# Patient Record
Sex: Female | Born: 1955
Health system: Southern US, Community
[De-identification: ages and names within clinical notes are randomized; demographics above are authoritative.]

## PROBLEM LIST (undated history)

## (undated) DIAGNOSIS — E039 Hypothyroidism, unspecified: Secondary | ICD-10-CM

## (undated) DIAGNOSIS — R252 Cramp and spasm: Secondary | ICD-10-CM

## (undated) DIAGNOSIS — M810 Age-related osteoporosis without current pathological fracture: Secondary | ICD-10-CM

## (undated) DIAGNOSIS — K219 Gastro-esophageal reflux disease without esophagitis: Secondary | ICD-10-CM

## (undated) DIAGNOSIS — G4700 Insomnia, unspecified: Secondary | ICD-10-CM

## (undated) DIAGNOSIS — M419 Scoliosis, unspecified: Secondary | ICD-10-CM

## (undated) DIAGNOSIS — M2550 Pain in unspecified joint: Secondary | ICD-10-CM

---

## 2005-07-22 ENCOUNTER — Emergency Department (HOSPITAL_COMMUNITY): Admission: EM | Admit: 2005-07-22 | Discharge: 2005-07-22 | Payer: Self-pay | Admitting: Emergency Medicine

## 2005-07-22 IMAGING — CT CT HEAD W/O CM
1 series · 16 of 28 positions shown, 20 images · IV contrast (agent unspecified)
Comparison: None.

CLINICAL DATA: Status post fall.  
 HEAD CT WITHOUT CONTRAST:
TECHNIQUE: Contiguous axial images were obtained from the base of the skull through the vertex according to standard protocol without contrast.

[Series 2: brain · axial · 0.47mm/px · z∈[+126,+246]mm · 16 of 28 slices shown, 20 images]
[im 2/28  brain]
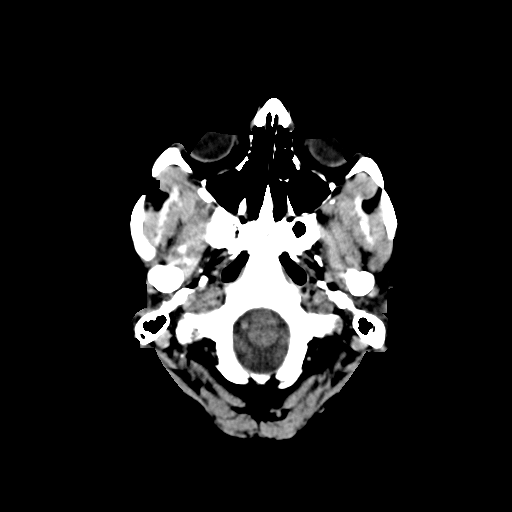
[im 2/28  bone]
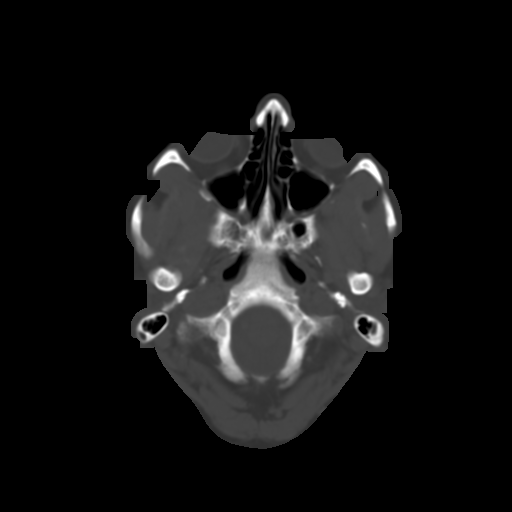
[im 4/28  brain]
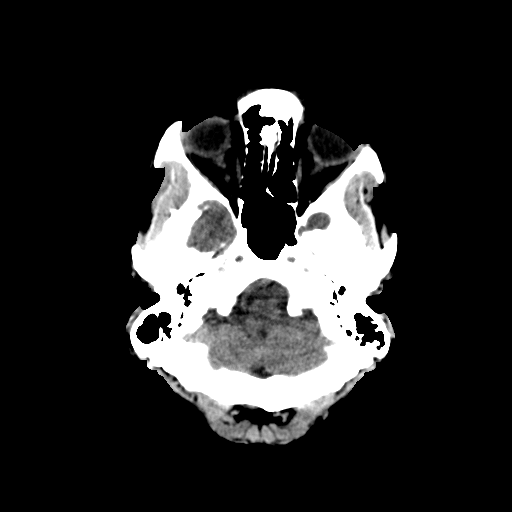
[im 6/28  brain]
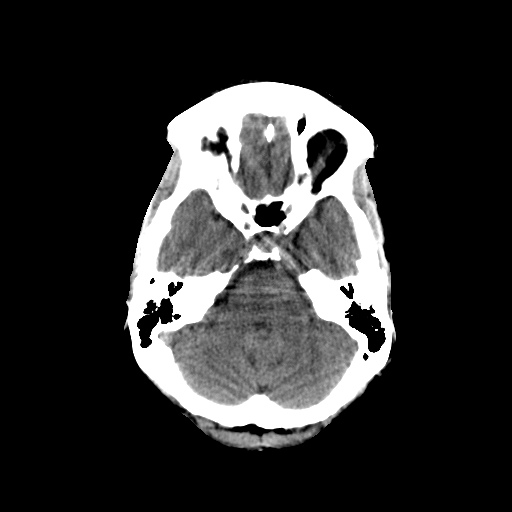
[im 7/28  brain]
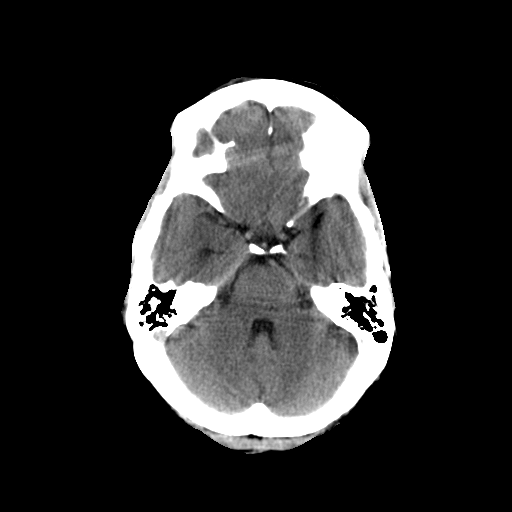
[im 9/28  brain]
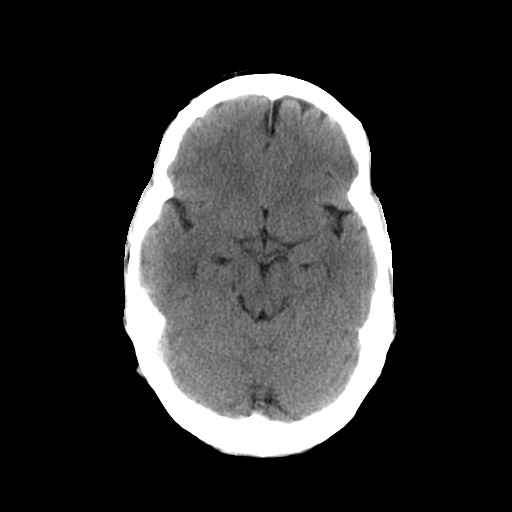
[im 9/28  bone]
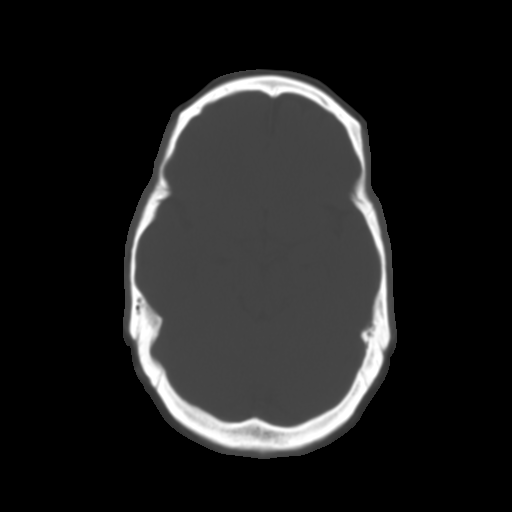
[im 10/28  brain]
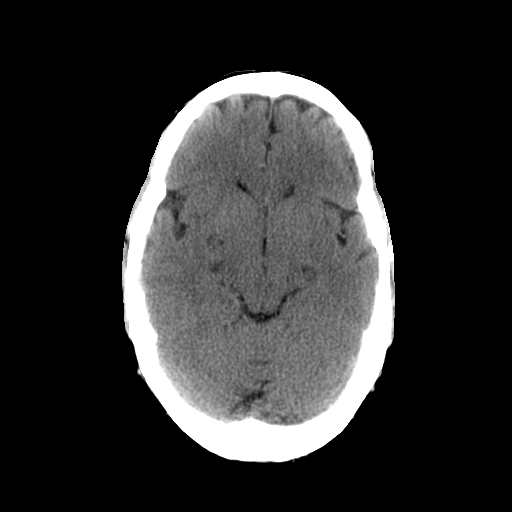
[im 12/28  brain]
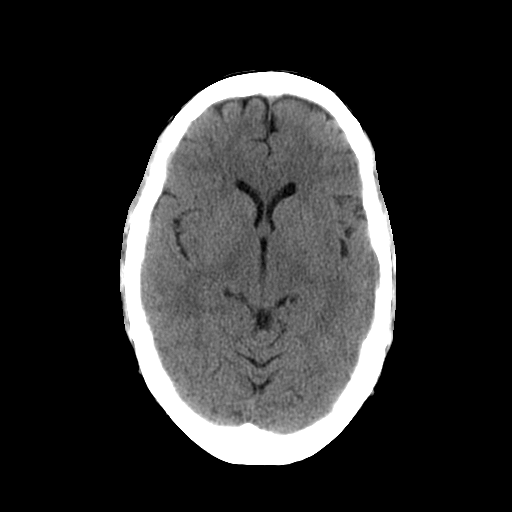
[im 14/28  brain]
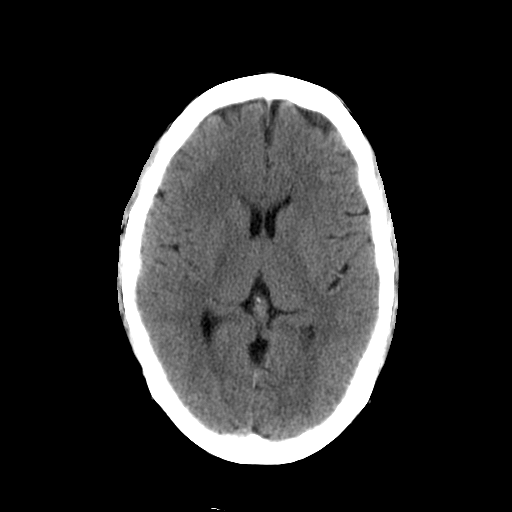
[im 15/28  brain]
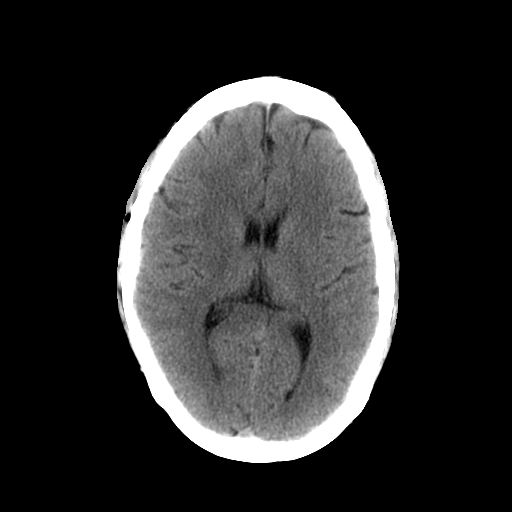
[im 15/28  bone]
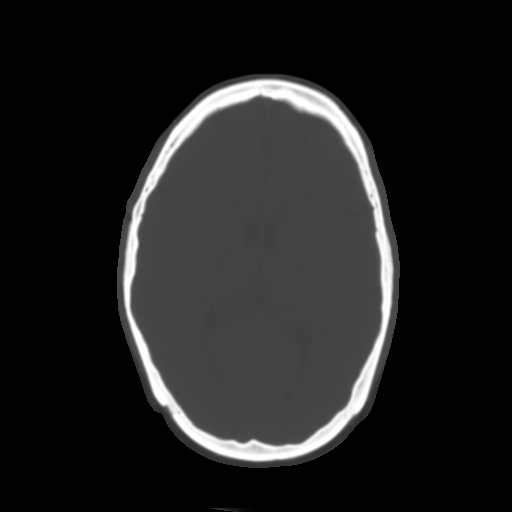
[im 17/28  brain]
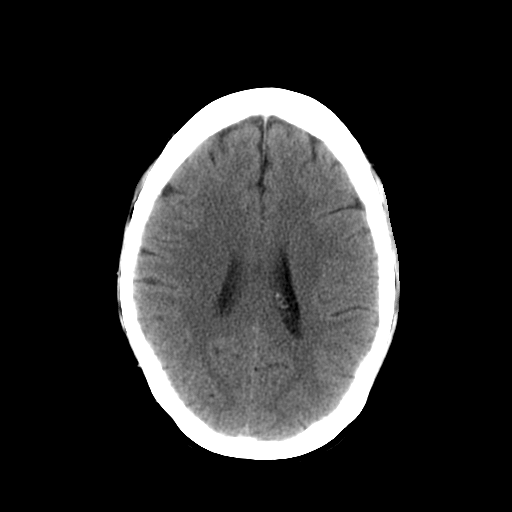
[im 19/28  brain]
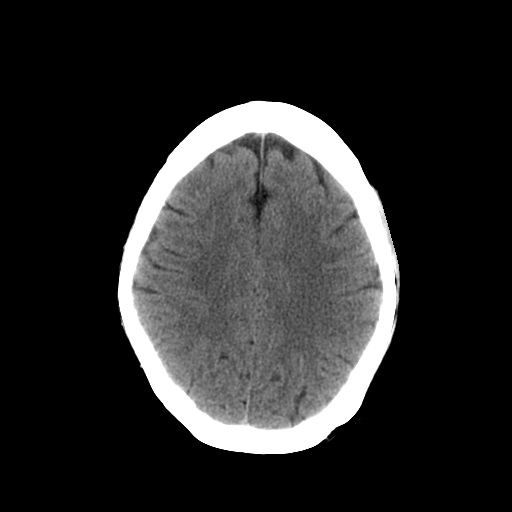
[im 20/28  brain]
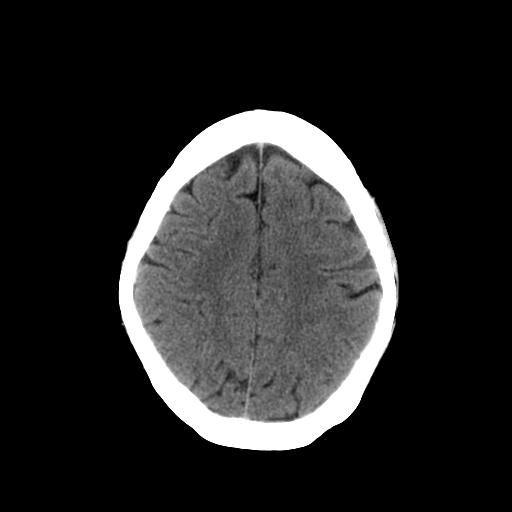
[im 22/28  brain]
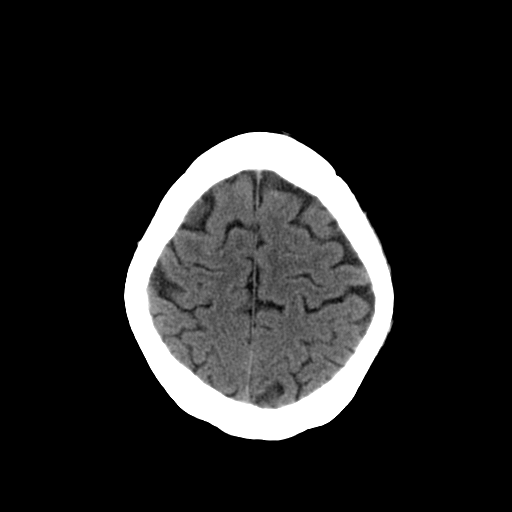
[im 22/28  bone]
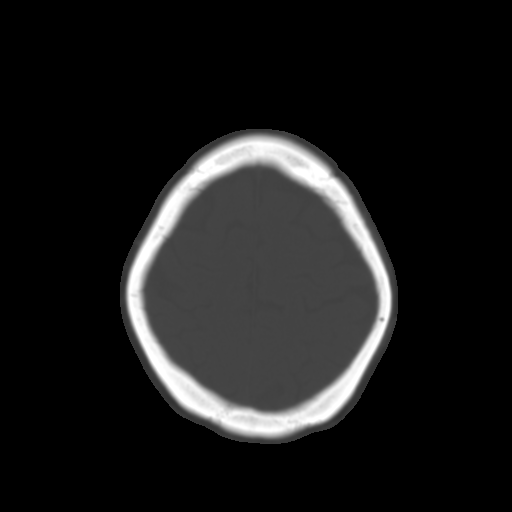
[im 23/28  brain]
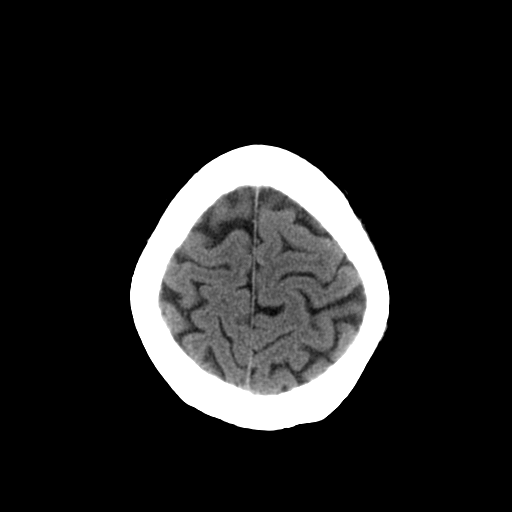
[im 25/28  brain]
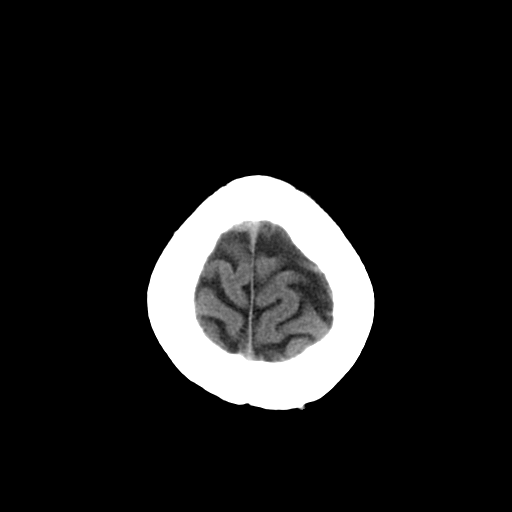
[im 27/28  brain]
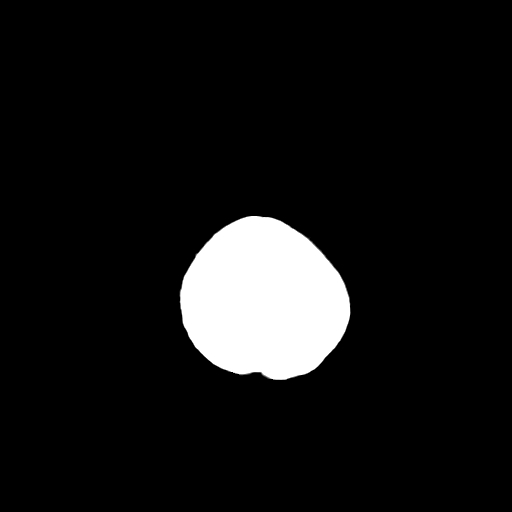

[16 of 28 positions shown; findings below may reference images not displayed]

FINDINGS: Brain parenchyma is normal in attenuation and morphology.  The ventricular volumes are normal.  No abnormal extraaxial fluid collections, intracranial hemorrhage, or masses.
 Mastoid air cells and paranasal sinuses are normally aerated.
IMPRESSION: No acute intracranial abnormality.

## 2010-03-24 ENCOUNTER — Ambulatory Visit: Payer: Self-pay | Admitting: Diagnostic Radiology

## 2010-03-24 ENCOUNTER — Ambulatory Visit (HOSPITAL_BASED_OUTPATIENT_CLINIC_OR_DEPARTMENT_OTHER): Admission: RE | Admit: 2010-03-24 | Discharge: 2010-03-24 | Payer: Self-pay | Admitting: Family Medicine

## 2010-03-24 ENCOUNTER — Ambulatory Visit: Payer: Self-pay | Admitting: Family Medicine

## 2010-03-24 DIAGNOSIS — M549 Dorsalgia, unspecified: Secondary | ICD-10-CM | POA: Insufficient documentation

## 2010-03-24 DIAGNOSIS — M79609 Pain in unspecified limb: Secondary | ICD-10-CM | POA: Insufficient documentation

## 2010-03-25 ENCOUNTER — Telehealth (INDEPENDENT_AMBULATORY_CARE_PROVIDER_SITE_OTHER): Payer: Self-pay | Admitting: *Deleted

## 2010-03-25 DIAGNOSIS — M4120 Other idiopathic scoliosis, site unspecified: Secondary | ICD-10-CM | POA: Insufficient documentation

## 2010-03-31 ENCOUNTER — Telehealth (INDEPENDENT_AMBULATORY_CARE_PROVIDER_SITE_OTHER): Payer: Self-pay | Admitting: *Deleted

## 2010-04-01 ENCOUNTER — Telehealth (INDEPENDENT_AMBULATORY_CARE_PROVIDER_SITE_OTHER): Payer: Self-pay | Admitting: *Deleted

## 2010-04-11 ENCOUNTER — Telehealth (INDEPENDENT_AMBULATORY_CARE_PROVIDER_SITE_OTHER): Payer: Self-pay | Admitting: *Deleted

## 2010-04-11 ENCOUNTER — Ambulatory Visit: Payer: Self-pay | Admitting: Family Medicine

## 2010-04-11 DIAGNOSIS — R21 Rash and other nonspecific skin eruption: Secondary | ICD-10-CM | POA: Insufficient documentation

## 2010-04-22 ENCOUNTER — Encounter: Admission: RE | Admit: 2010-04-22 | Discharge: 2010-07-07 | Payer: Self-pay | Admitting: Orthopedic Surgery

## 2010-10-07 ENCOUNTER — Ambulatory Visit: Payer: Self-pay | Admitting: Family Medicine

## 2010-10-07 DIAGNOSIS — N329 Bladder disorder, unspecified: Secondary | ICD-10-CM | POA: Insufficient documentation

## 2010-10-07 DIAGNOSIS — R350 Frequency of micturition: Secondary | ICD-10-CM | POA: Insufficient documentation

## 2010-10-07 LAB — CONVERTED CEMR LAB
Bilirubin Urine: NEGATIVE
Blood in Urine, dipstick: NEGATIVE
Glucose, Urine, Semiquant: NEGATIVE
Ketones, urine, test strip: NEGATIVE
Nitrite: NEGATIVE
Protein, U semiquant: NEGATIVE
Specific Gravity, Urine: 1.01
Urobilinogen, UA: 0.2
WBC Urine, dipstick: NEGATIVE
pH: 7.5

## 2010-10-27 ENCOUNTER — Encounter: Payer: Self-pay | Admitting: Family Medicine

## 2010-12-06 NOTE — Progress Notes (Signed)
Summary: naprelan refill   Phone Note Refill Request Message from:  Fax from Pharmacy on Mar 31, 2010 9:42 AM  Refills Requested: Medication #1:  NAPROXEN DR 500 MG TBEC 1 tab by mouth two times a day x7 days and then as needed.  take w/ food. [facility] - [address] - fax [phone] - ph [phone] - note from [facility] :med is on mfg back order until late summer can we change to napr   Method Requested: Fax to Local Pharmacy Initial call taken by: Okey Regal Spring,  Mar 31, 2010 9:47 AM    New/Updated Medications: NAPRELAN 500 MG XR24H-TAB (NAPROXEN SODIUM) take one tablet two times a day x7 days Prescriptions: NAPRELAN 500 MG XR24H-TAB (NAPROXEN SODIUM) take one tablet two times a day x7 days  #60 x 1   Entered by:   Doristine Devoid   Authorized by:   Neena Rhymes MD   Signed by:   Doristine Devoid on 03/31/2010   Method used:   Electronically to        [facility] [address] (retail)       [phone]       [place], [place]  [address]       Ph: [phone]       Fax: [phone]   RxID:   [id number]

## 2010-12-06 NOTE — Assessment & Plan Note (Signed)
Summary: URINARY FREQUENCY/RH..........Marland Kitchen   Vital Signs:  Patient profile:   55 year old female Weight:      132 pounds BMI:     24.63 Pulse rate:   76 / minute BP sitting:   114 / 62  (left arm)  Vitals Entered By: Doristine Devoid CMA (October 07, 2010 2:14 PM) CC: urinary frequency x a while and some pain in L lower leg    History of Present Illness: 55 yo woman here today for  1) Urinary frequency- having suprapubic pressure.  sxs started 'awhile ago'.  denies dysuria.  intermittant hesitancy.  no blood in the urine.  no fevers or chills.  2) L lower leg pain- pain between ankle and knee.  lateral.  intermittant.  improves w/ ibuprofen.  will last for up to a few hrs.  'a couple of times' in the last week.  no increase in activity level- pt is very active.  3) vaginal mass- notices a 'round thing sticking out when washing'.  not painful.  noticed a few months ago, 3-4 months.  denies increase in size.  no bleeding.  Current Medications (verified): 1)  Naprelan 500 Mg Xr24h-Tab (Naproxen Sodium) .... Take One Tablet Two Times A Day X7 Days  Allergies (verified): 1)  ! * Aleve  Past History:  Past medical, surgical, family and social histories (including risk factors) reviewed, and no changes noted (except as noted below).  Past Medical History: scoliosis bladder prolapse  Past Surgical History: Reviewed history from 03/24/2010 and no changes required. none  Family History: Reviewed history from 03/24/2010 and no changes required. CAD-father deceased MI 81's HTN-no DM- 2 brothers STROKE-no COLON CA-no BREAST CA-maternal aunt  Social History: Reviewed history from 03/24/2010 and no changes required. married, 2 children (live in [place], [place]) owns business- [employer]  Review of Systems      See HPI  Physical Exam  General:  Well-developed,well-nourished,in no acute distress; alert,appropriate and cooperative throughout examination Abdomen:  soft, NT/ND,  +BS.  no suprapubic or CVA tenderness Genitalia:  prolapse visible at vaginal opening- on bimanual exam this is found to be bladder and not uterus Msk:  full ROM of L knee and ankle no pain w/ ROM or palpation of knee, ankle, or lower leg no edema or other visible abnormality Pulses:  +2 DP/PT Extremities:  no C/C/E Neurologic:  strength normal in all extremities, sensation intact to light touch, gait normal, and DTRs symmetrical and normal.     Impression & Recommendations:  Problem # 1:  FREQUENCY, URINARY (ICD-788.41) Assessment New no evidence of infxn.  most likely due to worsening bladder prolapse- see below. Orders: UA Dipstick w/o Micro (manual) (16109) Urology Referral (Urology)  Problem # 2:  BLADDER PROLAPSE (ICD-596.9) Assessment: New cause of pt's urinary frequency.  refer to urology. Orders: UA Dipstick w/o Micro (manual) (60454) Urology Referral (Urology)  Problem # 3:  LEG PAIN (ICD-729.5) Assessment: New pt's pain not reproducible today.  encouraging and re-assuring that pain responds to ibuprofen.  no abnormalities on PE.  reviewed supportive care and red flags that should prompt return.  Pt expresses understanding and is in agreement w/ this plan.  Complete Medication List: 1)  Naprelan 500 Mg Xr24h-tab (Naproxen sodium) .... Take one tablet two times a day x7 days  Patient Instructions: 1)  We will call you with your urology appt 2)  Continue the ibuprofen for your leg as needed 3)  Call with any questions or concerns 4)  Happy Holidays!   Orders Added: 1)  UA Dipstick w/o Micro (manual) [81002] 2)  Urology Referral [Urology] 3)  Est. Patient Level IV [60454]    Laboratory Results   Urine Tests    Routine Urinalysis   Glucose: negative   (Normal Range: Negative) Bilirubin: negative   (Normal Range: Negative) Ketone: negative   (Normal Range: Negative) Spec. Gravity: 1.010   (Normal Range: 1.003-1.035) Blood: negative   (Normal Range:  Negative) pH: 7.5   (Normal Range: 5.0-8.0) Protein: negative   (Normal Range: Negative) Urobilinogen: 0.2   (Normal Range: 0-1) Nitrite: negative   (Normal Range: Negative) Leukocyte Esterace: negative   (Normal Range: Negative)

## 2010-12-06 NOTE — Assessment & Plan Note (Signed)
Summary: swelling r leg, rash/alr   Vital Signs:  Patient profile:   55 year old female Weight:      133 pounds Pulse rate:   64 / minute BP sitting:   104 / 68  (left arm)  Vitals Entered By: Doristine Devoid (April 11, 2010 11:41 AM) CC: rash on both arms very itchy   History of Present Illness: 55 yo woman here today for rash on both arms.  developed rash after taking Aleve.  rash started Wed.  Stopped taking Aleve on Friday.  Rash has improved since stopping medicine.  no itching on hands, feet, trunk.  R arm much improved but L still very itchy.  has been using clear calamine lotion.  originally prescribed Naproxen and took 3 day supply w/out rxn but when switched to OTC Aleve developed rash.  denies any other changes in soaps, lotions, detergents.  has not been doing yardwork.  Current Medications (verified): 1)  Naprelan 500 Mg Xr24h-Tab (Naproxen Sodium) .... Take One Tablet Two Times A Day X7 Days  Allergies (verified): 1)  ! * Aleve  Review of Systems      See HPI  Physical Exam  General:  Well-developed,well-nourished,in no acute distress; alert,appropriate and cooperative throughout examination Extremities:  R leg- improved edema, abrasions are healed Skin:  erythematous macular/papular rash w/ excoriations on arms bilaterally.  no vesicles or signs of infxn.   Impression & Recommendations:  Problem # 1:  RASH-NONVESICULAR (ICD-782.1) Assessment New possibly due to OTC Aleve but odd that only areas effected are the arms.  as pt's sxs are improving after stopping the medication will assume this is the trigger.  did not react to naproxen script- must have been filler in the OTC medication.  offered hydrocortisone 2.5% but pt would prefer not to use any meds at this time.  Problem # 2:  LEG PAIN, RIGHT (ICD-729.5) improving.  less edema and bruising.  Complete Medication List: 1)  Naprelan 500 Mg Xr24h-tab (Naproxen sodium) .... Take one tablet two times a day x7  days  Patient Instructions: 1)  If your rash worsens or you decide you want a cream to help with the itching- please call 2)  NO MORE ALEVE 3)  Good luck w/ Dr Shon Baton today!

## 2010-12-06 NOTE — Progress Notes (Signed)
Summary: RASH  ov scheduled  Phone Note Call from Patient Call back at [facility] Phone [phone]   Summary of Call: PATIENT SAID SHE STARTED TAKING ALEVE & BROKE OUT IN A RASH - SHE STOPPED TAKING IT & THE RASH ISNT AS BAD - SHE REFUSED APPT Initial call taken by: Okey Regal Spring,  April 11, 2010 9:26 AM  Follow-up for Phone Call        Spoke with pt who has been taking 2 Aleve for 3 days for swelling of her right leg. developed a rash on both arms. After stopping aleve rash has gone from one arm, seems to think it was Aleve, still has swelling of leg.  OV Scheduled today .Kandice Hams  April 11, 2010 9:45 AM  Follow-up by: Kandice Hams,  April 11, 2010 9:45 AM

## 2010-12-06 NOTE — Progress Notes (Signed)
Summary: xray results  Phone Note Outgoing Call   Call placed by: Doristine Devoid,  Mar 25, 2010 9:30 AM Call placed to: Patient Summary of Call: please refer pt to ortho for severe scoliosis  no break seen.  likely just a severe bruise.  continue w/ NSAIDs, ice, and elevation as discussed at appt  Follow-up for Phone Call        left message on machine .........Marland KitchenDoristine Devoid  Mar 25, 2010 9:30 AM  spoke w/ patient aware of xray report .........Marland KitchenDoristine Devoid  Mar 28, 2010 10:50 AM   New Problems: SCOLIOSIS, THORACIC SPINE (ICD-737.30) SCOLIOSIS, LUMBAR SPINE (ICD-737.30)   New Problems: SCOLIOSIS, THORACIC SPINE (ICD-737.30) SCOLIOSIS, LUMBAR SPINE (ICD-737.30)

## 2010-12-06 NOTE — Progress Notes (Signed)
Summary: Med Doasge Change  Phone Note From Pharmacy Call back at [phone]   Caller: [facility] [address] Summary of Call: Pertaining to rx for Naproxen:  No longer available, would like to change to regular 500mg ? If so different sig? Please phone or fax to [phone]. Thanks Initial call taken by: Harold Barban,  Apr 01, 2010 8:15 AM  Follow-up for Phone Call        spoke w/ [facility] informed ok to fill brand naproxen and also informed patient to be sure and take insurance when picking up medication informed that if med is to expensive to let us know before the weekend but if unable to make she can take otc aleve two times a day until she get medication picked up from pharmacy .......Marland KitchenDoristine Devoid  Apr 01, 2010 2:50 PM
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
## 2010-12-08 NOTE — Consult Note (Signed)
Summary: [facility]   Imported By: Lanelle Bal 11/09/2010 12:25:35  _____________________________________________________________________  External Attachment:    Type:   Image     Comment:   External Document

## 2011-11-23 ENCOUNTER — Ambulatory Visit (INDEPENDENT_AMBULATORY_CARE_PROVIDER_SITE_OTHER): Payer: BC Managed Care – PPO | Admitting: *Deleted

## 2011-11-23 DIAGNOSIS — Z23 Encounter for immunization: Secondary | ICD-10-CM

## 2014-04-17 ENCOUNTER — Other Ambulatory Visit (HOSPITAL_BASED_OUTPATIENT_CLINIC_OR_DEPARTMENT_OTHER): Payer: Self-pay | Admitting: Obstetrics and Gynecology

## 2014-04-17 DIAGNOSIS — Z1231 Encounter for screening mammogram for malignant neoplasm of breast: Secondary | ICD-10-CM

## 2014-04-24 ENCOUNTER — Ambulatory Visit (HOSPITAL_BASED_OUTPATIENT_CLINIC_OR_DEPARTMENT_OTHER)
Admission: RE | Admit: 2014-04-24 | Discharge: 2014-04-24 | Disposition: A | Discharge status: Home / Self Care | Payer: BC Managed Care – PPO | Source: Ambulatory Visit | Attending: Obstetrics and Gynecology | Admitting: Obstetrics and Gynecology

## 2014-04-24 DIAGNOSIS — Z1231 Encounter for screening mammogram for malignant neoplasm of breast: Secondary | ICD-10-CM | POA: Insufficient documentation

## 2015-02-02 ENCOUNTER — Encounter: Payer: Self-pay | Admitting: Medical

## 2015-02-02 ENCOUNTER — Ambulatory Visit (HOSPITAL_BASED_OUTPATIENT_CLINIC_OR_DEPARTMENT_OTHER)
Admission: RE | Admit: 2015-02-02 | Discharge: 2015-02-02 | Disposition: A | Discharge status: Home / Self Care | Payer: BLUE CROSS/BLUE SHIELD | Source: Ambulatory Visit | Attending: Medical | Admitting: Medical

## 2015-02-02 ENCOUNTER — Ambulatory Visit (INDEPENDENT_AMBULATORY_CARE_PROVIDER_SITE_OTHER): Payer: BLUE CROSS/BLUE SHIELD | Admitting: Medical

## 2015-02-02 VITALS — BP 118/64 | HR 74 | Temp 98.0°F | Ht 62.0 in | Wt 124.8 lb

## 2015-02-02 DIAGNOSIS — J209 Acute bronchitis, unspecified: Secondary | ICD-10-CM | POA: Insufficient documentation

## 2015-02-02 DIAGNOSIS — J329 Chronic sinusitis, unspecified: Secondary | ICD-10-CM | POA: Insufficient documentation

## 2015-02-02 DIAGNOSIS — R509 Fever, unspecified: Secondary | ICD-10-CM | POA: Diagnosis not present

## 2015-02-02 DIAGNOSIS — R0989 Other specified symptoms and signs involving the circulatory and respiratory systems: Secondary | ICD-10-CM | POA: Diagnosis not present

## 2015-02-02 DIAGNOSIS — R05 Cough: Secondary | ICD-10-CM | POA: Diagnosis present

## 2015-02-02 DIAGNOSIS — R918 Other nonspecific abnormal finding of lung field: Secondary | ICD-10-CM | POA: Insufficient documentation

## 2015-02-02 DIAGNOSIS — J0100 Acute maxillary sinusitis, unspecified: Secondary | ICD-10-CM

## 2015-02-02 LAB — CBC WITH DIFFERENTIAL/PLATELET
BASOS ABS: 0 10*3/uL (ref 0.0–0.1)
BASOS PCT: 0.7 % (ref 0.0–3.0)
Eosinophils Absolute: 0.2 10*3/uL (ref 0.0–0.7)
Eosinophils Relative: 2.4 % (ref 0.0–5.0)
HEMATOCRIT: 35.8 % — AB (ref 36.0–46.0)
Hemoglobin: 12.3 g/dL (ref 12.0–15.0)
LYMPHS ABS: 1.4 10*3/uL (ref 0.7–4.0)
LYMPHS PCT: 20.9 % (ref 12.0–46.0)
MCHC: 34.3 g/dL (ref 30.0–36.0)
MCV: 89.7 fl (ref 78.0–100.0)
MONOS PCT: 10.2 % (ref 3.0–12.0)
Monocytes Absolute: 0.7 10*3/uL (ref 0.1–1.0)
Neutro Abs: 4.3 10*3/uL (ref 1.4–7.7)
Neutrophils Relative %: 65.8 % (ref 43.0–77.0)
Platelets: 262 10*3/uL (ref 150.0–400.0)
RBC: 3.99 Mil/uL (ref 3.87–5.11)
RDW: 11.8 % (ref 11.5–15.5)
WBC: 6.6 10*3/uL (ref 4.0–10.5)

## 2015-02-02 MED ORDER — AMOXICILLIN-POT CLAVULANATE 875-125 MG PO TABS: 1.0000 | ORAL_TABLET | Freq: Two times a day (BID) | ORAL | Status: DC

## 2015-02-02 MED ORDER — MOMETASONE FUROATE 50 MCG/ACT NA SUSP: NASAL | Status: DC

## 2015-02-02 MED ORDER — BENZONATATE 100 MG PO CAPS: 100.0000 mg | ORAL_CAPSULE | Freq: Three times a day (TID) | ORAL | Status: DC | PRN

## 2015-02-02 NOTE — Assessment & Plan Note (Signed)
You appear to have bronchitis as well. Antibiotic for sinus should treat both.  I do want to get chest xray to evaluate rt upper lung region since you report some pain in this area. Would like to evaluate for pneumonia. Get cbc today as well.

## 2015-02-02 NOTE — Patient Instructions (Signed)
Sinusitis Your appear to have a sinus infection. I am prescribing augmentin  antibiotic for the infection. To help with the nasal congestion I prescribed nasal steroid nasonex. For your associated cough, I prescribed cough medicine benzonatate  Rest, hydrate, tylenol for fever.  Follow up in 7 days or as needed.   Acute bronchitis You appear to have bronchitis as well. Antibiotic for sinus should treat both.  I do want to get chest xray to evaluate rt upper lung region since you report some pain in this area. Would like to evaluate for pneumonia. Get cbc today as well.     Rt ear wax. Use debrox for 7 days. Can irrigate in 10 days. Irrigation no with recent pain and unable to visualize tm not recommended.  Follow up in 7-10 days or as needed.

## 2015-02-02 NOTE — Progress Notes (Signed)
Pre visit review using our clinic review tool, if applicable. No additional management support is needed unless otherwise documented below in the visit note. 

## 2015-02-02 NOTE — Progress Notes (Signed)
Subjective:    Patient ID: Nancy Farley, female    DOB: [birth year], 59 y.o.   MRN: [id number]  HPI   I have reviewed pt PMH, PSH, FH, Social History and Surgical History  Pt in states 2 weeks of very severe runny nose. Nasal congestion. Thick yellow mucuos from her nose for one week. Also some rt side back pain. This pain is for past 2 days. Fever 101.2 day before yesterday. She took advil.  Pt grandaughter was sick. Grandaughter has been on antibitioc for 2 weeks. Pt does not have diffuse myalgias. She is tired.   Pt seen 3 yrs ago. She already has diagnosis on problem list. Getting restablished and will see Dr. Birdie Riddle per pt.        Review of Systems  Constitutional: Positive for fever and fatigue.  HENT: Positive for congestion, ear pain, rhinorrhea and sinus pressure.   Respiratory: Positive for cough. Negative for chest tightness, shortness of breath and wheezing.   Cardiovascular: Negative for chest pain and palpitations.  Musculoskeletal: Positive for back pain.       Mild on and off rt upper posterior thorax pain.  Neurological: Negative for dizziness and headaches.  Hematological: Negative for adenopathy. Does not bruise/bleed easily.   No past medical history on file.  History   Social History  . Marital Status: Married    Spouse Name: N/A  . Number of Children: N/A  . Years of Education: N/A   Occupational History  . Not on file.   Social History Main Topics  . Smoking status: Never Smoker   . Smokeless tobacco: Never Used  . Alcohol Use: No  . Drug Use: No  . Sexual Activity: Not on file   Other Topics Concern  . Not on file   Social History Narrative  . No narrative on file    No past surgical history on file.  Family History  Problem Relation Age of Onset  . Asthma Mother   . Heart disease Father     No Active Allergies  No current outpatient prescriptions on file prior to visit.   No current facility-administered medications on file  prior to visit.    BP 118/64 mmHg  Pulse 74  Temp(Src) 98 F (36.7 C) (Oral)  Ht 5\' 2"  (1.575 m)  Wt 124 lb 12.8 oz (56.609 kg)  BMI 22.82 kg/m2  SpO2 100%      Objective:   Physical Exam  General  Mental Status - Alert. General Appearance - Well groomed. Not in acute distress.  Skin Rashes- No Rashes.  HEENT Head- Normal. Ear Auditory Canal - Left- Normal. Right - Normal.Tympanic Membrane- Left- Normal. Right- Normal. Eye Sclera/Conjunctiva- Left- Normal. Right- Normal. Nose & Sinuses Nasal Mucosa- Left-  Boggy and Congested. Right-  Boggy and  Congested.Bilateral maxillary and frontal sinus pressure. Mouth & Throat Lips: Upper Lip- Normal: no dryness, cracking, pallor, cyanosis, or vesicular eruption. Lower Lip-Normal: no dryness, cracking, pallor, cyanosis or vesicular eruption. Buccal Mucosa- Bilateral- No Aphthous ulcers. Oropharynx- No Discharge or Erythema. Tonsils: Characteristics- Bilateral- No Erythema or Congestion. Size/Enlargement- Bilateral- No enlargement. Discharge- bilateral-None.  Neck Neck- Supple. No Masses.   Chest and Lung Exam Auscultation: Breath Sounds:-Even and unlabored. Faint upper lobe rhonchi. Mild worse rt side.  Cardiovascular Auscultation:Rythm- Regular, rate and rhythm. Murmurs & Other Heart Sounds:Ausculatation of the heart reveal- No Murmurs.  Lymphatic Head & Neck General Head & Neck Lymphatics: Bilateral: Description- No Localized lymphadenopathy.  Assessment & Plan:

## 2015-02-02 NOTE — Assessment & Plan Note (Signed)
Your appear to have a sinus infection. I am prescribing augmentin  antibiotic for the infection. To help with the nasal congestion I prescribed nasal steroid nasonex. For your associated cough, I prescribed cough medicine benzonatate  Rest, hydrate, tylenol for fever.  Follow up in 7 days or as needed.

## 2015-10-27 ENCOUNTER — Ambulatory Visit: Payer: BLUE CROSS/BLUE SHIELD | Admitting: Medical

## 2015-10-27 ENCOUNTER — Encounter: Payer: Self-pay | Admitting: Medical

## 2015-10-27 ENCOUNTER — Telehealth: Payer: Self-pay | Admitting: Medical

## 2015-10-27 ENCOUNTER — Ambulatory Visit (INDEPENDENT_AMBULATORY_CARE_PROVIDER_SITE_OTHER): Payer: BLUE CROSS/BLUE SHIELD | Admitting: Medical

## 2015-10-27 VITALS — BP 116/68 | HR 77 | Temp 98.3°F | Ht 62.0 in | Wt 135.0 lb

## 2015-10-27 DIAGNOSIS — H6691 Otitis media, unspecified, right ear: Secondary | ICD-10-CM

## 2015-10-27 DIAGNOSIS — J029 Acute pharyngitis, unspecified: Secondary | ICD-10-CM | POA: Diagnosis not present

## 2015-10-27 DIAGNOSIS — H109 Unspecified conjunctivitis: Secondary | ICD-10-CM | POA: Diagnosis not present

## 2015-10-27 MED ORDER — MOXIFLOXACIN HCL 0.5 % OP SOLN: 1.0000 [drp] | Freq: Three times a day (TID) | OPHTHALMIC | Status: DC

## 2015-10-27 MED ORDER — AMOXICILLIN-POT CLAVULANATE 875-125 MG PO TABS: 1.0000 | ORAL_TABLET | Freq: Two times a day (BID) | ORAL | Status: DC

## 2015-10-27 MED ORDER — BENZONATATE 100 MG PO CAPS: 100.0000 mg | ORAL_CAPSULE | Freq: Three times a day (TID) | ORAL | Status: DC | PRN

## 2015-10-27 NOTE — Progress Notes (Signed)
Pre visit review using our clinic review tool, if applicable. No additional management support is needed unless otherwise documented below in the visit note. 

## 2015-10-27 NOTE — Progress Notes (Signed)
Subjective:    Patient ID: Nancy Farley, female    DOB: 1956/02/19, 59 y.o.   MRN: [id number]  HPI  Pt in with one week of cough.  Also she had runny nose, nasal congestion, and sneezing. Mild throat irritation. Pt grandchild sick with similar symptoms and had pinkeye.  Fever 100.9 yesterday.   One day of left eye matting that started yesterday in the afternoon. Pt was wiping and clearing all day. Then this am when she woke up had yellow discharge with lids stuck together.   Review of Systems  Constitutional: Positive for fever and fatigue. Negative for chills.  HENT: Positive for congestion and rhinorrhea. Negative for mouth sores, sinus pressure, sneezing and tinnitus.   Eyes: Positive for discharge and redness. Negative for visual disturbance.  Respiratory: Positive for cough. Negative for choking, shortness of breath and wheezing.   Cardiovascular: Negative for chest pain and palpitations.  Gastrointestinal: Negative for abdominal pain.  Musculoskeletal: Negative for myalgias, back pain, neck pain and neck stiffness.  Neurological: Negative for dizziness and headaches.  Hematological: Negative for adenopathy. Does not bruise/bleed easily.  Psychiatric/Behavioral: Negative for behavioral problems.    No past medical history on file.  Social History   Social History  . Marital Status: Married    Spouse Name: N/A  . Number of Children: N/A  . Years of Education: N/A   Occupational History  . Not on file.   Social History Main Topics  . Smoking status: Never Smoker   . Smokeless tobacco: Never Used  . Alcohol Use: No  . Drug Use: No  . Sexual Activity: Not on file   Other Topics Concern  . Not on file   Social History Narrative    No past surgical history on file.  Family History  Problem Relation Age of Onset  . Asthma Mother   . Heart disease Father     No Active Allergies  No current outpatient prescriptions on file prior to visit.   No current  facility-administered medications on file prior to visit.    BP 116/68 mmHg  Pulse 77  Temp(Src) 98.3 F (36.8 C) (Oral)  Ht 5\' 2"  (1.575 m)  Wt 135 lb (61.236 kg)  BMI 24.69 kg/m2  SpO2 97%       Objective:   Physical Exam  General  Mental Status - Alert. General Appearance - Well groomed. Not in acute distress.  Skin Rashes- No Rashes.  HEENT Head- Normal.  Eye- rt eye normal.      Lt eye-pinkish/red injected. No dc presently. Upper lid mild faint swollen at lid margin.  Ear Auditory Canal - Left- Normal. Right - Normal.Tympanic Membrane- Left- Normal. Right- red Eye Sclera/Conjunctiva- Left- Normal. Right- Normal. Nose & Sinuses Nasal Mucosa- Left-  Boggy and Congested. Right-  Boggy and  Congested.Bilateral no  maxillary and no frontal sinus pressure. Mouth & Throat Lips: Upper Lip- Normal: no dryness, cracking, pallor, cyanosis, or vesicular eruption. Lower Lip-Normal: no dryness, cracking, pallor, cyanosis or vesicular eruption. Buccal Mucosa- Bilateral- No Aphthous ulcers. Oropharynx- No Discharge or Erythema. +pnd Tonsils: Characteristics- Bilateral- mild Erythema and  Congestion. Size/Enlargement- Bilateral- No enlargement. Discharge- bilateral-None.  Neck Neck- Supple. No Masses.   Chest and Lung Exam Auscultation: Breath Sounds:-Clear even and unlabored.  Cardiovascular Auscultation:Rythm- Regular, rate and rhythm. Murmurs & Other Heart Sounds:Ausculatation of the heart reveal- No Murmurs.  Lymphatic Head & Neck General Head & Neck Lymphatics: Bilateral: Description- No Localized lymphadenopathy.  Assessment & Plan:  On exam you appear to have lt side ear infection. Also your throat looks mild suspicious for infection. Will rx augmentin antibiotic.  For conjunctivitis rx vigamox.  For current nasal congestion consider flonase over the counter.  For cough benzonatate.  Follow up in 5-7days or as needed

## 2015-10-27 NOTE — Patient Instructions (Addendum)
On exam you appear to have lt side ear infection. Also your throat looks mild suspicious for infection. Will rx augmentin antibiotic.  For conjunctivitis rx vigamox.  For current nasal congestion consider flonase over the counter.  For cough benzonatate.  Follow up in 5-7days or as needed

## 2015-10-27 NOTE — Telephone Encounter (Signed)
Percell Miller spoke with pharmacist and gave new Rx.

## 2015-10-27 NOTE — Telephone Encounter (Signed)
Caller name: Pharmacy   Can be reached: [phone]  Pharmacy:  [facility] [place], [place] [phone] (Phone) [phone] (Fax)         Reason for call: Pharmacy is requesting another medication because the one sent in needs a PA. moxifloxacin (VIGAMOX) 0.5 % ophthalmic solution [id number]

## 2015-10-29 ENCOUNTER — Telehealth: Payer: Self-pay | Admitting: *Deleted

## 2015-10-29 NOTE — Telephone Encounter (Signed)
PA approved effective from 10/29/2015 through 11/05/2038. JG//CMA

## 2015-10-29 NOTE — Telephone Encounter (Signed)
Initiated, awaiting determination through [email or web address]. JG//CMA

## 2016-07-19 ENCOUNTER — Ambulatory Visit (INDEPENDENT_AMBULATORY_CARE_PROVIDER_SITE_OTHER): Payer: BLUE CROSS/BLUE SHIELD | Admitting: Medical

## 2016-07-19 ENCOUNTER — Encounter: Payer: Self-pay | Admitting: Medical

## 2016-07-19 ENCOUNTER — Ambulatory Visit: Payer: BLUE CROSS/BLUE SHIELD | Admitting: Medical

## 2016-07-19 VITALS — BP 103/65 | HR 66 | Temp 98.2°F | Ht 62.0 in | Wt 124.2 lb

## 2016-07-19 DIAGNOSIS — N390 Urinary tract infection, site not specified: Secondary | ICD-10-CM | POA: Diagnosis not present

## 2016-07-19 DIAGNOSIS — Z1211 Encounter for screening for malignant neoplasm of colon: Secondary | ICD-10-CM

## 2016-07-19 DIAGNOSIS — R82998 Other abnormal findings in urine: Secondary | ICD-10-CM

## 2016-07-19 DIAGNOSIS — Z23 Encounter for immunization: Secondary | ICD-10-CM | POA: Diagnosis not present

## 2016-07-19 DIAGNOSIS — Z0000 Encounter for general adult medical examination without abnormal findings: Secondary | ICD-10-CM | POA: Diagnosis not present

## 2016-07-19 LAB — POCT URINALYSIS DIPSTICK
BILIRUBIN UA: NEGATIVE
GLUCOSE UA: NEGATIVE
KETONES UA: NEGATIVE
NITRITE UA: NEGATIVE
Protein, UA: NEGATIVE
RBC UA: NEGATIVE
Spec Grav, UA: 1.025
Urobilinogen, UA: 4
pH, UA: 6

## 2016-07-19 MED ORDER — TYPHOID VACCINE PO CPDR: 1.0000 | DELAYED_RELEASE_CAPSULE | ORAL | 0 refills | Status: DC

## 2016-07-19 MED ORDER — ATOVAQUONE-PROGUANIL HCL 250-100 MG PO TABS: ORAL_TABLET | ORAL | 0 refills | Status: DC

## 2016-07-19 MED ORDER — DOXYCYCLINE HYCLATE 100 MG PO TABS: ORAL_TABLET | ORAL | 1 refills | Status: DC

## 2016-07-19 MED FILL — ATOVAQUONE-PROGUANIL 250-10: 250-100 | 30 days supply | Qty: 30 | Fill #0

## 2016-07-19 NOTE — Addendum Note (Signed)
Addended by: Anabel Halon on: 07/19/2016 05:03 PM   Modules accepted: Orders

## 2016-07-19 NOTE — Patient Instructions (Addendum)
For your wellness exam please get fasting cmp, cbc, lipid panel and tsh. UA done today.  Please get mammogram done that your gyn had ordered.  Please sign release of info form so we can get dexascan.   You declined flu vaccine today. But you can always get later if you change you mind.   For hep c screening if you desire would recommend calling your insurance first. And if they cover screening or if you desire we can check.   I put in referral for colonoscopy. Appears greater than 10 years since last one.  Regarding vaccines you report having had hep A vaccine. Will give first in series today and repeat in 6 months. Staff checked and fluvaccine given back in 2013.  Will rx vivotif today for typhoid prevention.  Will call health dept/travel clinic  regarding recommendation on malaria prevention.  Follow up date to be determined after lab evaluation or as needed  Preventive Care for Adults, Female A healthy lifestyle and preventive care can promote health and wellness. Preventive health guidelines for women include the following key practices.  A routine yearly physical is a good way to check with your health care provider about your health and preventive screening. It is a chance to share any concerns and updates on your health and to receive a thorough exam.  Visit your dentist for a routine exam and preventive care every 6 months. Brush your teeth twice a day and floss once a day. Good oral hygiene prevents tooth decay and gum disease.  The frequency of eye exams is based on your age, health, family medical history, use of contact lenses, and other factors. Follow your health care provider's recommendations for frequency of eye exams.  Eat a healthy diet. Foods like vegetables, fruits, whole grains, low-fat dairy products, and lean protein foods contain the nutrients you need without too many calories. Decrease your intake of foods high in solid fats, added sugars, and salt. Eat the  right amount of calories for you.Get information about a proper diet from your health care provider, if necessary.  Regular physical exercise is one of the most important things you can do for your health. Most adults should get at least 150 minutes of moderate-intensity exercise (any activity that increases your heart rate and causes you to sweat) each week. In addition, most adults need muscle-strengthening exercises on 2 or more days a week.  Maintain a healthy weight. The body mass index (BMI) is a screening tool to identify possible weight problems. It provides an estimate of body fat based on height and weight. Your health care provider can find your BMI and can help you achieve or maintain a healthy weight.For adults 20 years and older:  A BMI below 18.5 is considered underweight.  A BMI of 18.5 to 24.9 is normal.  A BMI of 25 to 29.9 is considered overweight.  A BMI of 30 and above is considered obese.  Maintain normal blood lipids and cholesterol levels by exercising and minimizing your intake of saturated fat. Eat a balanced diet with plenty of fruit and vegetables. Blood tests for lipids and cholesterol should begin at age 21 and be repeated every 5 years. If your lipid or cholesterol levels are high, you are over 50, or you are at high risk for heart disease, you may need your cholesterol levels checked more frequently.Ongoing high lipid and cholesterol levels should be treated with medicines if diet and exercise are not working.  If you smoke, find  out from your health care provider how to quit. If you do not use tobacco, do not start.  Lung cancer screening is recommended for adults aged 25-80 years who are at high risk for developing lung cancer because of a history of smoking. A yearly low-dose CT scan of the lungs is recommended for people who have at least a 30-pack-year history of smoking and are a current smoker or have quit within the past 15 years. A pack year of smoking is  smoking an average of 1 pack of cigarettes a day for 1 year (for example: 1 pack a day for 30 years or 2 packs a day for 15 years). Yearly screening should continue until the smoker has stopped smoking for at least 15 years. Yearly screening should be stopped for people who develop a health problem that would prevent them from having lung cancer treatment.  If you are pregnant, do not drink alcohol. If you are breastfeeding, be very cautious about drinking alcohol. If you are not pregnant and choose to drink alcohol, do not have more than 1 drink per day. One drink is considered to be 12 ounces (355 mL) of beer, 5 ounces (148 mL) of wine, or 1.5 ounces (44 mL) of liquor.  Avoid use of street drugs. Do not share needles with anyone. Ask for help if you need support or instructions about stopping the use of drugs.  High blood pressure causes heart disease and increases the risk of stroke. Your blood pressure should be checked at least every 1 to 2 years. Ongoing high blood pressure should be treated with medicines if weight loss and exercise do not work.  If you are 51-52 years old, ask your health care provider if you should take aspirin to prevent strokes.  Diabetes screening is done by taking a blood sample to check your blood glucose level after you have not eaten for a certain period of time (fasting). If you are not overweight and you do not have risk factors for diabetes, you should be screened once every 3 years starting at age 16. If you are overweight or obese and you are 21-56 years of age, you should be screened for diabetes every year as part of your cardiovascular risk assessment.  Breast cancer screening is essential preventive care for women. You should practice "breast self-awareness." This means understanding the normal appearance and feel of your breasts and may include breast self-examination. Any changes detected, no matter how small, should be reported to a health care provider. Women  in their 67s and 30s should have a clinical breast exam (CBE) by a health care provider as part of a regular health exam every 1 to 3 years. After age 19, women should have a CBE every year. Starting at age 65, women should consider having a mammogram (breast X-ray test) every year. Women who have a family history of breast cancer should talk to their health care provider about genetic screening. Women at a high risk of breast cancer should talk to their health care providers about having an MRI and a mammogram every year.  Breast cancer gene (BRCA)-related cancer risk assessment is recommended for women who have family members with BRCA-related cancers. BRCA-related cancers include breast, ovarian, tubal, and peritoneal cancers. Having family members with these cancers may be associated with an increased risk for harmful changes (mutations) in the breast cancer genes BRCA1 and BRCA2. Results of the assessment will determine the need for genetic counseling and BRCA1 and BRCA2 testing.  Your health care provider may recommend that you be screened regularly for cancer of the pelvic organs (ovaries, uterus, and vagina). This screening involves a pelvic examination, including checking for microscopic changes to the surface of your cervix (Pap test). You may be encouraged to have this screening done every 3 years, beginning at age 19.  For women ages 25-65, health care providers may recommend pelvic exams and Pap testing every 3 years, or they may recommend the Pap and pelvic exam, combined with testing for human papilloma virus (HPV), every 5 years. Some types of HPV increase your risk of cervical cancer. Testing for HPV may also be done on women of any age with unclear Pap test results.  Other health care providers may not recommend any screening for nonpregnant women who are considered low risk for pelvic cancer and who do not have symptoms. Ask your health care provider if a screening pelvic exam is right for  you.  If you have had past treatment for cervical cancer or a condition that could lead to cancer, you need Pap tests and screening for cancer for at least 20 years after your treatment. If Pap tests have been discontinued, your risk factors (such as having a new sexual partner) need to be reassessed to determine if screening should resume. Some women have medical problems that increase the chance of getting cervical cancer. In these cases, your health care provider may recommend more frequent screening and Pap tests.  Colorectal cancer can be detected and often prevented. Most routine colorectal cancer screening begins at the age of 93 years and continues through age 22 years. However, your health care provider may recommend screening at an earlier age if you have risk factors for colon cancer. On a yearly basis, your health care provider may provide home test kits to check for hidden blood in the stool. Use of a small camera at the end of a tube, to directly examine the colon (sigmoidoscopy or colonoscopy), can detect the earliest forms of colorectal cancer. Talk to your health care provider about this at age 25, when routine screening begins. Direct exam of the colon should be repeated every 5-10 years through age 31 years, unless early forms of precancerous polyps or small growths are found.  People who are at an increased risk for hepatitis B should be screened for this virus. You are considered at high risk for hepatitis B if:  You were born in a country where hepatitis B occurs often. Talk with your health care provider about which countries are considered high risk.  Your parents were born in a high-risk country and you have not received a shot to protect against hepatitis B (hepatitis B vaccine).  You have HIV or AIDS.  You use needles to inject street drugs.  You live with, or have sex with, someone who has hepatitis B.  You get hemodialysis treatment.  You take certain medicines for  conditions like cancer, organ transplantation, and autoimmune conditions.  Hepatitis C blood testing is recommended for all people born from [birth year] through 1966 and any individual with known risks for hepatitis C.  Practice safe sex. Use condoms and avoid high-risk sexual practices to reduce the spread of sexually transmitted infections (STIs). STIs include gonorrhea, chlamydia, syphilis, trichomonas, herpes, HPV, and human immunodeficiency virus (HIV). Herpes, HIV, and HPV are viral illnesses that have no cure. They can result in disability, cancer, and death.  You should be screened for sexually transmitted illnesses (STIs) including gonorrhea and chlamydia  if:  You are sexually active and are younger than 24 years.  You are older than 24 years and your health care provider tells you that you are at risk for this type of infection.  Your sexual activity has changed since you were last screened and you are at an increased risk for chlamydia or gonorrhea. Ask your health care provider if you are at risk.  If you are at risk of being infected with HIV, it is recommended that you take a prescription medicine daily to prevent HIV infection. This is called preexposure prophylaxis (PrEP). You are considered at risk if:  You are sexually active and do not regularly use condoms or know the HIV status of your partner(s).  You take drugs by injection.  You are sexually active with a partner who has HIV.  Talk with your health care provider about whether you are at high risk of being infected with HIV. If you choose to begin PrEP, you should first be tested for HIV. You should then be tested every 3 months for as long as you are taking PrEP.  Osteoporosis is a disease in which the bones lose minerals and strength with aging. This can result in serious bone fractures or breaks. The risk of osteoporosis can be identified using a bone density scan. Women ages 16 years and over and women at risk for  fractures or osteoporosis should discuss screening with their health care providers. Ask your health care provider whether you should take a calcium supplement or vitamin D to reduce the rate of osteoporosis.  Menopause can be associated with physical symptoms and risks. Hormone replacement therapy is available to decrease symptoms and risks. You should talk to your health care provider about whether hormone replacement therapy is right for you.  Use sunscreen. Apply sunscreen liberally and repeatedly throughout the day. You should seek shade when your shadow is shorter than you. Protect yourself by wearing long sleeves, pants, a wide-brimmed hat, and sunglasses year round, whenever you are outdoors.  Once a month, do a whole body skin exam, using a mirror to look at the skin on your back. Tell your health care provider of new moles, moles that have irregular borders, moles that are larger than a pencil eraser, or moles that have changed in shape or color.  Stay current with required vaccines (immunizations).  Influenza vaccine. All adults should be immunized every year.  Tetanus, diphtheria, and acellular pertussis (Td, Tdap) vaccine. Pregnant women should receive 1 dose of Tdap vaccine during each pregnancy. The dose should be obtained regardless of the length of time since the last dose. Immunization is preferred during the 27th-36th week of gestation. An adult who has not previously received Tdap or who does not know her vaccine status should receive 1 dose of Tdap. This initial dose should be followed by tetanus and diphtheria toxoids (Td) booster doses every 10 years. Adults with an unknown or incomplete history of completing a 3-dose immunization series with Td-containing vaccines should begin or complete a primary immunization series including a Tdap dose. Adults should receive a Td booster every 10 years.  Varicella vaccine. An adult without evidence of immunity to varicella should receive 2  doses or a second dose if she has previously received 1 dose. Pregnant females who do not have evidence of immunity should receive the first dose after pregnancy. This first dose should be obtained before leaving the health care facility. The second dose should be obtained 4-8 weeks after the  first dose.  Human papillomavirus (HPV) vaccine. Females aged 13-26 years who have not received the vaccine previously should obtain the 3-dose series. The vaccine is not recommended for use in pregnant females. However, pregnancy testing is not needed before receiving a dose. If a female is found to be pregnant after receiving a dose, no treatment is needed. In that case, the remaining doses should be delayed until after the pregnancy. Immunization is recommended for any person with an immunocompromised condition through the age of 98 years if she did not get any or all doses earlier. During the 3-dose series, the second dose should be obtained 4-8 weeks after the first dose. The third dose should be obtained 24 weeks after the first dose and 16 weeks after the second dose.  Zoster vaccine. One dose is recommended for adults aged 66 years or older unless certain conditions are present.  Measles, mumps, and rubella (MMR) vaccine. Adults born before [birth year] generally are considered immune to measles and mumps. Adults born in [birth year] or later should have 1 or more doses of MMR vaccine unless there is a contraindication to the vaccine or there is laboratory evidence of immunity to each of the three diseases. A routine second dose of MMR vaccine should be obtained at least 28 days after the first dose for students attending postsecondary schools, health care workers, or international travelers. People who received inactivated measles vaccine or an unknown type of measles vaccine during 1963-1967 should receive 2 doses of MMR vaccine. People who received inactivated mumps vaccine or an unknown type of mumps vaccine before 1979 and  are at high risk for mumps infection should consider immunization with 2 doses of MMR vaccine. For females of childbearing age, rubella immunity should be determined. If there is no evidence of immunity, females who are not pregnant should be vaccinated. If there is no evidence of immunity, females who are pregnant should delay immunization until after pregnancy. Unvaccinated health care workers born before [birth year] who lack laboratory evidence of measles, mumps, or rubella immunity or laboratory confirmation of disease should consider measles and mumps immunization with 2 doses of MMR vaccine or rubella immunization with 1 dose of MMR vaccine.  Pneumococcal 13-valent conjugate (PCV13) vaccine. When indicated, a person who is uncertain of his immunization history and has no record of immunization should receive the PCV13 vaccine. All adults 44 years of age and older should receive this vaccine. An adult aged 32 years or older who has certain medical conditions and has not been previously immunized should receive 1 dose of PCV13 vaccine. This PCV13 should be followed with a dose of pneumococcal polysaccharide (PPSV23) vaccine. Adults who are at high risk for pneumococcal disease should obtain the PPSV23 vaccine at least 8 weeks after the dose of PCV13 vaccine. Adults older than 61 years of age who have normal immune system function should obtain the PPSV23 vaccine dose at least 1 year after the dose of PCV13 vaccine.  Pneumococcal polysaccharide (PPSV23) vaccine. When PCV13 is also indicated, PCV13 should be obtained first. All adults aged 51 years and older should be immunized. An adult younger than age 12 years who has certain medical conditions should be immunized. Any person who resides in a nursing home or long-term care facility should be immunized. An adult smoker should be immunized. People with an immunocompromised condition and certain other conditions should receive both PCV13 and PPSV23 vaccines. People  with human immunodeficiency virus (HIV) infection should be immunized as soon as possible after  diagnosis. Immunization during chemotherapy or radiation therapy should be avoided. Routine use of PPSV23 vaccine is not recommended for American Indians, [place] Natives, or people younger than 65 years unless there are medical conditions that require PPSV23 vaccine. When indicated, people who have unknown immunization and have no record of immunization should receive PPSV23 vaccine. One-time revaccination 5 years after the first dose of PPSV23 is recommended for people aged 19-64 years who have chronic kidney failure, nephrotic syndrome, asplenia, or immunocompromised conditions. People who received 1-2 doses of PPSV23 before age 72 years should receive another dose of PPSV23 vaccine at age 35 years or later if at least 5 years have passed since the previous dose. Doses of PPSV23 are not needed for people immunized with PPSV23 at or after age 58 years.  Meningococcal vaccine. Adults with asplenia or persistent complement component deficiencies should receive 2 doses of quadrivalent meningococcal conjugate (MenACWY-D) vaccine. The doses should be obtained at least 2 months apart. Microbiologists working with certain meningococcal bacteria, [employer] recruits, people at risk during an outbreak, and people who travel to or live in countries with a high rate of meningitis should be immunized. A first-year college student up through age 49 years who is living in a residence hall should receive a dose if she did not receive a dose on or after her 16th birthday. Adults who have certain high-risk conditions should receive one or more doses of vaccine.  Hepatitis A vaccine. Adults who wish to be protected from this disease, have certain high-risk conditions, work with hepatitis A-infected animals, work in hepatitis A research labs, or travel to or work in countries with a high rate of hepatitis A should be immunized. Adults  who were previously unvaccinated and who anticipate close contact with an international adoptee during the first 60 days after arrival in the [place] from a country with a high rate of hepatitis A should be immunized.  Hepatitis B vaccine. Adults who wish to be protected from this disease, have certain high-risk conditions, may be exposed to blood or other infectious body fluids, are household contacts or sex partners of hepatitis B positive people, are clients or workers in certain care facilities, or travel to or work in countries with a high rate of hepatitis B should be immunized.  Haemophilus influenzae type b (Hib) vaccine. A previously unvaccinated person with asplenia or sickle cell disease or having a scheduled splenectomy should receive 1 dose of Hib vaccine. Regardless of previous immunization, a recipient of a hematopoietic stem cell transplant should receive a 3-dose series 6-12 months after her successful transplant. Hib vaccine is not recommended for adults with HIV infection. Preventive Services / Frequency Ages 44 to 30 years  Blood pressure check.** / Every 3-5 years.  Lipid and cholesterol check.** / Every 5 years beginning at age 60.  Clinical breast exam.** / Every 3 years for women in their 10s and 19s.  BRCA-related cancer risk assessment.** / For women who have family members with a BRCA-related cancer (breast, ovarian, tubal, or peritoneal cancers).  Pap test.** / Every 2 years from ages 25 through 55. Every 3 years starting at age 71 through age 57 or 32 with a history of 3 consecutive normal Pap tests.  HPV screening.** / Every 3 years from ages 88 through ages 28 to 67 with a history of 3 consecutive normal Pap tests.  Hepatitis C blood test.** / For any individual with known risks for hepatitis C.  Skin self-exam. / Monthly.  Influenza  vaccine. / Every year.  Tetanus, diphtheria, and acellular pertussis (Tdap, Td) vaccine.** / Consult your health care  provider. Pregnant women should receive 1 dose of Tdap vaccine during each pregnancy. 1 dose of Td every 10 years.  Varicella vaccine.** / Consult your health care provider. Pregnant females who do not have evidence of immunity should receive the first dose after pregnancy.  HPV vaccine. / 3 doses over 6 months, if 36 and younger. The vaccine is not recommended for use in pregnant females. However, pregnancy testing is not needed before receiving a dose.  Measles, mumps, rubella (MMR) vaccine.** / You need at least 1 dose of MMR if you were born in 1957 or later. You may also need a 2nd dose. For females of childbearing age, rubella immunity should be determined. If there is no evidence of immunity, females who are not pregnant should be vaccinated. If there is no evidence of immunity, females who are pregnant should delay immunization until after pregnancy.  Pneumococcal 13-valent conjugate (PCV13) vaccine.** / Consult your health care provider.  Pneumococcal polysaccharide (PPSV23) vaccine.** / 1 to 2 doses if you smoke cigarettes or if you have certain conditions.  Meningococcal vaccine.** / 1 dose if you are age 42 to 15 years and a Market researcher living in a residence hall, or have one of several medical conditions, you need to get vaccinated against meningococcal disease. You may also need additional booster doses.  Hepatitis A vaccine.** / Consult your health care provider.  Hepatitis B vaccine.** / Consult your health care provider.  Haemophilus influenzae type b (Hib) vaccine.** / Consult your health care provider. Ages 90 to 37 years  Blood pressure check.** / Every year.  Lipid and cholesterol check.** / Every 5 years beginning at age 76 years.  Lung cancer screening. / Every year if you are aged 41-80 years and have a 30-pack-year history of smoking and currently smoke or have quit within the past 15 years. Yearly screening is stopped once you have quit smoking for at  least 15 years or develop a health problem that would prevent you from having lung cancer treatment.  Clinical breast exam.** / Every year after age 3 years.  BRCA-related cancer risk assessment.** / For women who have family members with a BRCA-related cancer (breast, ovarian, tubal, or peritoneal cancers).  Mammogram.** / Every year beginning at age 9 years and continuing for as long as you are in good health. Consult with your health care provider.  Pap test.** / Every 3 years starting at age 44 years through age 31 or 52 years with a history of 3 consecutive normal Pap tests.  HPV screening.** / Every 3 years from ages 81 years through ages 34 to 77 years with a history of 3 consecutive normal Pap tests.  Fecal occult blood test (FOBT) of stool. / Every year beginning at age 54 years and continuing until age 49 years. You may not need to do this test if you get a colonoscopy every 10 years.  Flexible sigmoidoscopy or colonoscopy.** / Every 5 years for a flexible sigmoidoscopy or every 10 years for a colonoscopy beginning at age 59 years and continuing until age 43 years.  Hepatitis C blood test.** / For all people born from [birth year] through 1966 and any individual with known risks for hepatitis C.  Skin self-exam. / Monthly.  Influenza vaccine. / Every year.  Tetanus, diphtheria, and acellular pertussis (Tdap/Td) vaccine.** / Consult your health care provider. Pregnant women should receive  1 dose of Tdap vaccine during each pregnancy. 1 dose of Td every 10 years.  Varicella vaccine.** / Consult your health care provider. Pregnant females who do not have evidence of immunity should receive the first dose after pregnancy.  Zoster vaccine.** / 1 dose for adults aged 51 years or older.  Measles, mumps, rubella (MMR) vaccine.** / You need at least 1 dose of MMR if you were born in 1957 or later. You may also need a second dose. For females of childbearing age, rubella immunity should be  determined. If there is no evidence of immunity, females who are not pregnant should be vaccinated. If there is no evidence of immunity, females who are pregnant should delay immunization until after pregnancy.  Pneumococcal 13-valent conjugate (PCV13) vaccine.** / Consult your health care provider.  Pneumococcal polysaccharide (PPSV23) vaccine.** / 1 to 2 doses if you smoke cigarettes or if you have certain conditions.  Meningococcal vaccine.** / Consult your health care provider.  Hepatitis A vaccine.** / Consult your health care provider.  Hepatitis B vaccine.** / Consult your health care provider.  Haemophilus influenzae type b (Hib) vaccine.** / Consult your health care provider. Ages 78 years and over  Blood pressure check.** / Every year.  Lipid and cholesterol check.** / Every 5 years beginning at age 18 years.  Lung cancer screening. / Every year if you are aged 31-80 years and have a 30-pack-year history of smoking and currently smoke or have quit within the past 15 years. Yearly screening is stopped once you have quit smoking for at least 15 years or develop a health problem that would prevent you from having lung cancer treatment.  Clinical breast exam.** / Every year after age 73 years.  BRCA-related cancer risk assessment.** / For women who have family members with a BRCA-related cancer (breast, ovarian, tubal, or peritoneal cancers).  Mammogram.** / Every year beginning at age 69 years and continuing for as long as you are in good health. Consult with your health care provider.  Pap test.** / Every 3 years starting at age 88 years through age 69 or 43 years with 3 consecutive normal Pap tests. Testing can be stopped between 65 and 70 years with 3 consecutive normal Pap tests and no abnormal Pap or HPV tests in the past 10 years.  HPV screening.** / Every 3 years from ages 20 years through ages 42 or 12 years with a history of 3 consecutive normal Pap tests. Testing can be  stopped between 65 and 70 years with 3 consecutive normal Pap tests and no abnormal Pap or HPV tests in the past 10 years.  Fecal occult blood test (FOBT) of stool. / Every year beginning at age 8 years and continuing until age 83 years. You may not need to do this test if you get a colonoscopy every 10 years.  Flexible sigmoidoscopy or colonoscopy.** / Every 5 years for a flexible sigmoidoscopy or every 10 years for a colonoscopy beginning at age 84 years and continuing until age 71 years.  Hepatitis C blood test.** / For all people born from [birth year] through 1965 and any individual with known risks for hepatitis C.  Osteoporosis screening.** / A one-time screening for women ages 65 years and over and women at risk for fractures or osteoporosis.  Skin self-exam. / Monthly.  Influenza vaccine. / Every year.  Tetanus, diphtheria, and acellular pertussis (Tdap/Td) vaccine.** / 1 dose of Td every 10 years.  Varicella vaccine.** / Consult your health care provider.  Zoster vaccine.** / 1 dose for adults aged 58 years or older.  Pneumococcal 13-valent conjugate (PCV13) vaccine.** / Consult your health care provider.  Pneumococcal polysaccharide (PPSV23) vaccine.** / 1 dose for all adults aged 33 years and older.  Meningococcal vaccine.** / Consult your health care provider.  Hepatitis A vaccine.** / Consult your health care provider.  Hepatitis B vaccine.** / Consult your health care provider.  Haemophilus influenzae type b (Hib) vaccine.** / Consult your health care provider. ** Family history and personal history of risk and conditions may change your health care provider's recommendations.   This information is not intended to replace advice given to you by your health care provider. Make sure you discuss any questions you have with your health care provider.   Document Released: 12/19/2001 Document Revised: 11/13/2014 Document Reviewed: 03/20/2011 Elsevier Interactive Patient  Education [employer].

## 2016-07-19 NOTE — Progress Notes (Addendum)
Subjective:    Patient ID: Nancy Farley, female    DOB: December 20, 1955, 60 y.o.   MRN: [id number]  HPI   Pt in today states feeling ok/well  Pt update me that for her scoliosis she is feeling better. Less pain. She has been using a brace and doing exercises. She saw spine specialist.  Pt is fasting today.   Pt is also giving to [place]. She is leaving end of this month.Pt is going to [place]  Pt states last time she was given hep A. She mentioned last time got typhoid. Pt is going to be there 3 weeks.   Pt states she never takes flu vaccine. Pt counseled and she declines getting despite counseling.   Pt does spine exercises but does not work out otherwise, Pt is a vegetarian, no smoke, no alcohol. Husband passed in 2015. 2 children.  Pt is getting hysterectomy November. She has prolapased uterus.  Pt has not done mammogram. Gyn has scheduled and she has not had time to get that done yet.   Pt had bone scan done by back specialist. Dexascan done. Showed some osteopenia.(when will see specialist in November) Dexascan done may or June 2017.     Review of Systems  Constitutional: Negative for chills and fatigue.  Respiratory: Negative for cough, chest tightness, shortness of breath and wheezing.   Cardiovascular: Negative for chest pain and palpitations.  Gastrointestinal: Negative for abdominal pain.  Musculoskeletal: Negative for back pain.       See hpi. History of scoliosis  Skin: Negative for rash.  Neurological: Negative for dizziness, speech difficulty, light-headedness, numbness and headaches.  Hematological: Negative for adenopathy. Does not bruise/bleed easily.  Psychiatric/Behavioral: Negative for agitation, behavioral problems, dysphoric mood and suicidal ideas. The patient is not nervous/anxious.    No past medical history on file.   Social History   Social History  . Marital status: Married    Spouse name: N/A  . Number of children:  N/A  . Years of education: N/A   Occupational History  . Not on file.   Social History Main Topics  . Smoking status: Never Smoker  . Smokeless tobacco: Never Used  . Alcohol use No  . Drug use: No  . Sexual activity: Not on file   Other Topics Concern  . Not on file   Social History Narrative  . No narrative on file    No past surgical history on file.  Family History  Problem Relation Age of Onset  . Asthma Mother   . Heart disease Father     No Active Allergies  Current Outpatient Prescriptions on File Prior to Visit  Medication Sig Dispense Refill  . benzonatate (TESSALON) 100 MG capsule Take 1 capsule (100 mg total) by mouth 3 (three) times daily as needed for cough. (Patient not taking: Reported on 07/19/2016) 21 capsule 0  . moxifloxacin (VIGAMOX) 0.5 % ophthalmic solution Place 1 drop into the left eye 3 (three) times daily. (Patient not taking: Reported on 07/19/2016) 3 mL 0   No current facility-administered medications on file prior to visit.     BP 103/65   Pulse 66   Temp 98.2 F (36.8 C) (Oral)   Ht 5\' 2"  (1.575 m)   Wt 124 lb 3.2 oz (56.3 kg)   SpO2 100%   BMI 22.72 kg/m       Objective:   Physical Exam  General Mental Status- Alert. General Appearance- Not in acute distress.  Skin General: Color- Normal Color. Moisture- Normal Moisture. No worrisome lesions or moles seen.  Neck Carotid Arteries- Normal color. Moisture- Normal Moisture. No carotid bruits. No JVD.  Chest and Lung Exam Auscultation: Breath Sounds:-Normal.  Cardiovascular Auscultation:Rythm- Regular. Murmurs & Other Heart Sounds:Auscultation of the heart reveals- No Murmurs.  Abdomen Inspection:-Inspeection Normal. Palpation/Percussion:Note:No mass. Palpation and Percussion of the abdomen reveal- Non Tender, Non Distended + BS, no rebound or guarding.    Neurologic Cranial Nerve exam:- CN III-XII intact(No nystagmus), symmetric smile. Strength:- 5/5 equal and  symmetric strength both upper and lower extremities.      Assessment & Plan:  For your wellness exam please get fasting cmp, cbc, lipid panel and tsh. UA done today.  Please get mammogram done that your gyn had ordered.  Please sign release of info form so we can get dexascan.   You declined flu vaccine today. But you can always get later if you change you mind.   For hep c screening if you desire would recommend calling your insurance first. And if they cover screening or if you desire we can check.  I put in referral for colonoscopy. Appears greater than 10 years since last one.  Regarding vaccines you report having had hep A vaccine.Will give first in series today and repeat in 6 months. Staff checked and fluvaccine given back in 2013.  Will rx vivotif today for typhoid prevention.  Will call health dept/travel clinic  regarding recommendation on malaria prevention. Tried to contact clinic but found cdc site. Doxycyline option. Will rx doxycyline send to pt pharmacy.(Explained to pt benefit vs risk of meds. Sometimes with short trips don't prescribe)She still expressed concern. Note researched option. I think malarone easier to take and maybe less side effects. If covered by insurance then cancel rx doxycycline. Discussed with Photographer at [employer]  Follow up date to be determined after lab evaluation or as needed

## 2016-07-20 ENCOUNTER — Telehealth: Payer: Self-pay | Admitting: Medical

## 2016-07-20 LAB — URINE CULTURE

## 2016-07-20 NOTE — Telephone Encounter (Signed)
I rx'd doxycycline and malarone. These were for malaria prevention while traveling to [place].   I had preferred malarone if pt insurance covered/was reasonable. Will you call our pharmacy down stairs to see which one was filled. Please cancel in epic the med that was not filled. Let me know which one pt filled.

## 2016-07-21 ENCOUNTER — Telehealth: Payer: Self-pay | Admitting: Medical

## 2016-07-21 MED ORDER — AMOXICILLIN 500 MG PO CAPS: 500.0000 mg | ORAL_CAPSULE | Freq: Two times a day (BID) | ORAL | 0 refills | Status: DC

## 2016-07-21 NOTE — Addendum Note (Signed)
Addended by: Anabel Halon on: 07/21/2016 06:24 PM   Modules accepted: Orders

## 2016-07-21 NOTE — Telephone Encounter (Addendum)
Called pharmacy per E. Saguier. Patient filled Malarone. Marland Kitchen

## 2016-07-24 ENCOUNTER — Other Ambulatory Visit (HOSPITAL_COMMUNITY)
Admission: RE | Admit: 2016-07-24 | Discharge: 2016-07-24 | Disposition: A | Discharge status: Home / Self Care | Payer: BLUE CROSS/BLUE SHIELD | Source: Ambulatory Visit | Attending: Medical | Admitting: Medical

## 2016-07-24 ENCOUNTER — Telehealth: Payer: Self-pay | Admitting: Medical

## 2016-07-24 ENCOUNTER — Other Ambulatory Visit: Payer: Self-pay | Admitting: Internal Medicine

## 2016-07-24 DIAGNOSIS — N760 Acute vaginitis: Secondary | ICD-10-CM | POA: Diagnosis present

## 2016-07-24 DIAGNOSIS — Z113 Encounter for screening for infections with a predominantly sexual mode of transmission: Secondary | ICD-10-CM | POA: Diagnosis not present

## 2016-07-24 DIAGNOSIS — N898 Other specified noninflammatory disorders of vagina: Secondary | ICD-10-CM

## 2016-07-24 MED ORDER — TYPHOID VACCINE PO CPDR: 1.0000 | DELAYED_RELEASE_CAPSULE | ORAL | 0 refills | Status: DC

## 2016-07-24 MED FILL — AMOXICILLIN 500 MG CAPSULE: 500 | 7 days supply | Qty: 14 | Fill #0

## 2016-07-24 NOTE — Addendum Note (Signed)
Addended by: Anabel Halon on: 07/24/2016 05:29 PM   Modules accepted: Orders

## 2016-07-24 NOTE — Telephone Encounter (Signed)
Pt aware of amoxicillin and vivotif interaction. Fact that will cancel out effect of vivotif. But she is symptomatic recent with some urinary symptoms and foul odor. She states with her pessary she has had some bacteria in past. So she feels strongly about being on amoxicillin for recent bacteria found in urine. Since she had foul odor I also got her to give sample placed urine ancillary studies and gave to lab today.  She will start vivotif 3 days after ending amoxicillin. Pharmacy states she will have partial protection while in [place] but won't be covered entire time.

## 2016-07-24 NOTE — Telephone Encounter (Signed)
Caller name:[place] Relationship to patient: Can be reached: Pharmacy:  Reason for call:Patient will need written rx of typhoid, can not take with antibiotics. Pharmacy is sending her here

## 2016-07-25 NOTE — Telephone Encounter (Signed)
Opened to review her upcoming use of amoxicillin and when to start vivotif per pharmacist.

## 2016-07-26 LAB — URINE CYTOLOGY ANCILLARY ONLY
CHLAMYDIA, DNA PROBE: NEGATIVE
NEISSERIA GONORRHEA: NEGATIVE
Trichomonas: NEGATIVE

## 2016-07-27 LAB — URINE CYTOLOGY ANCILLARY ONLY
BACTERIAL VAGINITIS: NEGATIVE
CANDIDA VAGINITIS: NEGATIVE

## 2016-08-01 ENCOUNTER — Other Ambulatory Visit: Payer: Self-pay | Admitting: Urology

## 2016-08-23 ENCOUNTER — Other Ambulatory Visit: Payer: Self-pay | Admitting: Obstetrics and Gynecology

## 2016-09-04 ENCOUNTER — Encounter (HOSPITAL_COMMUNITY): Payer: Self-pay

## 2016-09-04 NOTE — Patient Instructions (Addendum)
Your procedure is scheduled on:  Thursday, Nov. 2, 2017  Enter through the Main Entrance of [facility] at:  6:00 AM  Pick up the phone at the desk and dial [phone].  Call this number if you have problems the morning of surgery: [phone].  Remember: Do NOT eat food or drink after:  Midnight Wednesday  Take these medicines the morning of surgery with a SIP OF WATER:  None  Stop ALL herbal medications at this time   Do NOT wear jewelry (body piercing), metal hair clips/bobby pins, make-up, or nail polish. Do NOT wear lotions, powders, or perfumes.  You may wear deodorant. Do NOT shave for 48 hours prior to surgery. Do NOT bring valuables to the hospital. Contacts, dentures, or bridgework may not be worn into surgery.  Leave suitcase in car.  After surgery it may be brought to your room.  For patients admitted to the hospital, checkout time is 11:00 AM the day of discharge.

## 2016-09-05 ENCOUNTER — Other Ambulatory Visit: Payer: Self-pay

## 2016-09-05 ENCOUNTER — Encounter (HOSPITAL_COMMUNITY)
Admission: RE | Admit: 2016-09-05 | Discharge: 2016-09-05 | Disposition: A | Discharge status: Home / Self Care | Payer: BLUE CROSS/BLUE SHIELD | Source: Ambulatory Visit | Attending: Obstetrics and Gynecology | Admitting: Obstetrics and Gynecology

## 2016-09-05 ENCOUNTER — Encounter (HOSPITAL_COMMUNITY): Payer: Self-pay

## 2016-09-05 DIAGNOSIS — N83201 Unspecified ovarian cyst, right side: Secondary | ICD-10-CM | POA: Diagnosis not present

## 2016-09-05 DIAGNOSIS — N83202 Unspecified ovarian cyst, left side: Secondary | ICD-10-CM | POA: Diagnosis not present

## 2016-09-05 DIAGNOSIS — D251 Intramural leiomyoma of uterus: Secondary | ICD-10-CM | POA: Diagnosis not present

## 2016-09-05 DIAGNOSIS — N72 Inflammatory disease of cervix uteri: Secondary | ICD-10-CM | POA: Diagnosis not present

## 2016-09-05 DIAGNOSIS — N8110 Cystocele, unspecified: Secondary | ICD-10-CM | POA: Diagnosis present

## 2016-09-05 DIAGNOSIS — N814 Uterovaginal prolapse, unspecified: Secondary | ICD-10-CM | POA: Diagnosis not present

## 2016-09-05 HISTORY — DX: Gastro-esophageal reflux disease without esophagitis: K21.9

## 2016-09-05 HISTORY — DX: Cramp and spasm: R25.2

## 2016-09-05 HISTORY — DX: Age-related osteoporosis without current pathological fracture: M81.0

## 2016-09-05 HISTORY — DX: Pain in unspecified joint: M25.50

## 2016-09-05 HISTORY — DX: Insomnia, unspecified: G47.00

## 2016-09-05 HISTORY — DX: Hypothyroidism, unspecified: E03.9

## 2016-09-05 HISTORY — DX: Scoliosis, unspecified: M41.9

## 2016-09-05 LAB — BASIC METABOLIC PANEL
Anion gap: 8 (ref 5–15)
BUN: 15 mg/dL (ref 6–20)
CALCIUM: 9.1 mg/dL (ref 8.9–10.3)
CO2: 28 mmol/L (ref 22–32)
CREATININE: 0.68 mg/dL (ref 0.44–1.00)
Chloride: 101 mmol/L (ref 101–111)
GFR calc non Af Amer: >60 mL/min (ref >60)
Glucose, Bld: 103 mg/dL — ABNORMAL HIGH (ref 65–99)
Potassium: 4.2 mmol/L (ref 3.5–5.1)
SODIUM: 137 mmol/L (ref 135–145)

## 2016-09-05 LAB — CBC
HCT: 32.8 % — ABNORMAL LOW (ref 36.0–46.0)
Hemoglobin: 11.3 g/dL — ABNORMAL LOW (ref 12.0–15.0)
MCH: 30.8 pg (ref 26.0–34.0)
MCHC: 34.5 g/dL (ref 30.0–36.0)
MCV: 89.4 fL (ref 78.0–100.0)
PLATELETS: 342 10*3/uL (ref 150–400)
RBC: 3.67 MIL/uL — AB (ref 3.87–5.11)
RDW: 12.8 % (ref 11.5–15.5)
WBC: 9.3 10*3/uL (ref 4.0–10.5)

## 2016-09-06 ENCOUNTER — Telehealth: Payer: Self-pay | Admitting: Medical

## 2016-09-06 MED ORDER — PHENAZOPYRIDINE HCL 200 MG PO TABS
200.0000 mg | ORAL_TABLET | Freq: Once | ORAL | Status: DC
  Filled 2016-09-06: qty 1

## 2016-09-06 MED ORDER — DEXTROSE 5 % IV SOLN
5.0000 mg/kg | INTRAVENOUS | Status: DC
  Filled 2016-09-06: qty 6.75

## 2016-09-06 MED ORDER — GENTAMICIN SULFATE 40 MG/ML IJ SOLN
5.0000 mg/kg | INTRAVENOUS | Status: AC
  Administered 2016-09-07: 270 mg via INTRAVENOUS
  Filled 2016-09-06: qty 6.75

## 2016-09-06 NOTE — H&P (Signed)
CC/HPI: 2015:  Nancy Farley was assessed in 2011 for mild prolapse and mild urge incontinence with foot-on-the-floor syndrome on the mild to moderate decreased flow. She had a grade 2 or small grade 3 cystocele and diffuse grade 2 rectocele with a reasonably well-supported uterus and was treated with physical therapy.  In the last several months, she noticed she has vaginal bulging much more significant. She has to push her bladder up in order to urinate. The flow was poor associated with hesitancy and no straining. She can double void with urgency afterwards a small amount.  She voids frequently in the morning and gets up once a night. Since doing physical therapy, she no longer leaks.   her uterus descended to the introitus; I felt that she needed surgery she would likely best benefit from a transvaginal hysterectomy with cystocele repairand vault suspension and graft and probable rectocele repair. She would need gynecological consultation for any vaginal spotting. She would need a CT urogram to rule out silent hydronephrosis. Due to her husband's lung cancer she put things on hold.   Today  I reviewed the last note from Dr. cousins and she felt the spotting was from the cervix and not the uterus. A pessary had been utilized. Subsequently she has had urodynamics  the patient is currently being managed with a pessary. It is changed approximately every 2 months. She is continent and only has rare urge incontinence and denies bedwetting and stress incontinence. She does not wear a pad. She can void approximately every 2 hours and sometimes gets up once at night. She tolerates the pessary well but is scheduled for surgery in early November by myself and Dr. Garwin Brothers   The patient recently had urodynamics. She emptied efficiently. Her maximum capacity was 193 mL. The bladder was unstable with low-pressure contractions but she was able to inhibit them. She did not leak with Valsalva pressure 111 cm of water. The  study was performed with the pessary in place she did trigger some contractions noted during voluntary voiding she voided 190 mL with a maximum flow of 7 mL/s and maximum voiding pressure was 20 cm of water. EMG activity was within normal limits. Fluoroscopically she had a large cystocele noted. The details of the urodynamics are signed and dictated   Her urgency incontinence is improved from perhaps is from her fluid modifications that she has done  I removed the patient's diaphragm pessary. Her examination appeared similar. She had a moderate grade 3 cystocele with central defect. She had hyper mobility of the bladder neck but no stress incontinence even with the pessary in place. The grade 2 rectocele was mild  There is no other aggravating or relieving factors  There is no other associated signs and symptoms  The severity of the symptoms is moderate  The symptoms are ongoing and bothersome    a picture was drawn. The above surgery worse watchful waiting versus pessary was discussed. Mesh issues were discussed. She may not need a rectocele repair performed simultaneously   After a thorough review of the management options for the patient's condition the patient  elected to proceed with surgical therapy as noted above. We have discussed the potential benefits and risks of the procedure, side effects of the proposed treatment, the likelihood of the patient achieving the goals of the procedure, and any potential problems that might occur during the procedure or recuperation. Informed consent has been obtained.  I did not think she needed an upper tract x-ray  I drew her a picture and we talked about prolapse surgery in detail.  Pros, cons, general surgical and anesthetic risks, and other options including behavioral therapy, pessaries, and watchful waiting were discussed.  Natural history of prolapse was discussed. She understands that prolapse repairs are successful in 80-85% of cases for prolapse  symptoms and can recur anteriorly, posteriorly, and/or apically.  She understands that in most cases I use a graft and general risks were discussed. Surgical risks were described but not limited to the discussion of injury to neighboring structures including the bowel (with possible life-threatening sepsis and colostomy), bladder, urethra, vagina (all resulting in further surgery), and ureter (resulting in re-implantation).  We talked about injury to nerves/soft tissue leading to debilitating and intractable pelvic, abdominal, and lower extremity pain syndromes and neuropathies.  The risks of buttock pain, intractable dyspareunia, and vaginal narrowing and shortening with sequelae were discussed. Bleeding risks, transfusion rates, and infection were discussed.  The risk of persistent, de novo, or worsening bladder and/or bowel incontinence/dysfunction was discussed. The need for CIC was described as well the usual post-operative course. The patient understands that she might not reach her treatment goal and that she might be worse following surgery.     ALLERGIES: No Allergies    MEDICATIONS: Caltrate Plus TABS Oral  Centrum Silver Oral Tablet Chewable Oral     GU PSH: Complex cystometrogram, w/ void pressure and urethral pressure profile studies, any technique - 06/27/2016 Complex Uroflow - 06/27/2016 Emg surf Electrd - 06/27/2016 Inject For cystogram - 06/27/2016 Intrabd voidng Press - 06/27/2016     PSH Notes: No Surgical Problems   NON-GU PSH: No Non-GU PSH     GU PMH: Hydronephrosis Unspec, Hydronephrosis - 2015 Nocturia, Nocturia - 2015 Uterovaginal prolapse, unspecified, Uterovaginal prolapse - 2015 Weak Urinary Stream, Weak urinary stream - 2015 Cystocele, Unspec, Vaginal wall prolapse - 2014 Rectocele, Rectocele - 2014 Urge incontinence, Urge incontinence of urine - 2014 Urinary Frequency, Increased urinary frequency - 2014    NON-GU PMH: Encounter for general adult medical  examination without abnormal findings, Encounter for preventive health examination - 2015 Muscle weakness (generalized), Muscle weakness - 2014 Other lack of coordination, Other lack of coordination - 2014    FAMILY HISTORY: Asthma - Mother Death In The Family Father - Father Death In The Family Mother - Mother Diabetes - Brother Family Health Status Number - Runs In Family Heart Disease - Father   SOCIAL HISTORY: No Social History    Notes: Self-employed, Caffeine Use, Activities Of Daily Living, Self-reliant In Usual Daily Activities, Living Independently With Spouse, Exercise Habits, Tobacco Use, Occupation:, Marital History - Currently Married, Alcohol Use   REVIEW OF SYSTEMS:    GU Review Female:  Patient reports frequent urination. Patient denies trouble starting your stream, hard to postpone urination, currently pregnant, get up at night to urinate, leakage of urine, burning /pain with urination, have to strain to urinate, and stream starts and stops.  Gastrointestinal (Upper):  Patient reports indigestion/ heartburn. Patient denies nausea and vomiting.  Gastrointestinal (Lower):  Patient denies diarrhea and constipation.  Constitutional:  Patient denies fever, night sweats, weight loss, and fatigue.  Skin:  Patient denies skin rash/ lesion and itching.  Eyes:  Patient denies blurred vision and double vision.  Ears/ Nose/ Throat:  Patient denies sore throat and sinus problems.  Hematologic/Lymphatic:  Patient denies swollen glands and easy bruising.  Cardiovascular:  Patient denies leg swelling and chest pains.  Respiratory:  Patient denies cough and  shortness of breath.  Endocrine:  Patient reports excessive thirst.   Musculoskeletal:  Patient reports joint pain. Patient denies back pain.  Neurological:  Patient denies headaches and dizziness.  Psychologic:  Patient denies depression and anxiety.   VITAL SIGNS:     08/01/2016 02:03 PM  BP 111/66 mmHg  Pulse 65 /min   Temperature 97.8 F / 37 C   PAST DATA REVIEWED:   Source Of History:  Patient   PROCEDURES:    Urinalysis w/Scope - 81001  Dipstick Dipstick Cont'd Micro  Specimen: Voided Blood: Neg WBC/hpf: 0-5/hpf  Color: Yellow Protein: Trace RBC/hpf: 0-2/hpf  Appearance: Clear Nitrites: Neg Bacteria: NS (Not Seen)   Leukocyte Esterase: Trace     ASSESSMENT:     ICD-10 Details  1 GU:  Cystocele, Unspec - N81.10   2  Rectocele - N81.6

## 2016-09-06 NOTE — Telephone Encounter (Signed)
°  Relation to [id number] Call back [phone]   Reason for call:  Patient sent my chart message requesting     Reason: To address the following health maintenance concerns.  Zostavax  Hepatitis C Screening    Comments:

## 2016-09-07 ENCOUNTER — Encounter (HOSPITAL_COMMUNITY): Payer: Self-pay | Admitting: *Deleted

## 2016-09-07 ENCOUNTER — Encounter (HOSPITAL_COMMUNITY)
Admission: RE | Disposition: A | Discharge status: Home / Self Care | Payer: Self-pay | Source: Ambulatory Visit | Attending: Obstetrics and Gynecology

## 2016-09-07 ENCOUNTER — Ambulatory Visit (HOSPITAL_COMMUNITY): Payer: BLUE CROSS/BLUE SHIELD | Admitting: Anesthesiology

## 2016-09-07 ENCOUNTER — Ambulatory Visit (HOSPITAL_COMMUNITY)
Admission: RE | Admit: 2016-09-07 | Discharge: 2016-09-08 | Disposition: A | Discharge status: Home / Self Care | Payer: BLUE CROSS/BLUE SHIELD | Source: Ambulatory Visit | Attending: Obstetrics and Gynecology | Admitting: Obstetrics and Gynecology

## 2016-09-07 DIAGNOSIS — N83201 Unspecified ovarian cyst, right side: Secondary | ICD-10-CM | POA: Insufficient documentation

## 2016-09-07 DIAGNOSIS — M4120 Other idiopathic scoliosis, site unspecified: Secondary | ICD-10-CM

## 2016-09-07 DIAGNOSIS — N814 Uterovaginal prolapse, unspecified: Secondary | ICD-10-CM

## 2016-09-07 DIAGNOSIS — D251 Intramural leiomyoma of uterus: Secondary | ICD-10-CM | POA: Insufficient documentation

## 2016-09-07 DIAGNOSIS — Z90710 Acquired absence of both cervix and uterus: Secondary | ICD-10-CM

## 2016-09-07 DIAGNOSIS — N72 Inflammatory disease of cervix uteri: Secondary | ICD-10-CM | POA: Insufficient documentation

## 2016-09-07 DIAGNOSIS — Z90721 Acquired absence of ovaries, unilateral: Secondary | ICD-10-CM

## 2016-09-07 DIAGNOSIS — N83202 Unspecified ovarian cyst, left side: Secondary | ICD-10-CM | POA: Insufficient documentation

## 2016-09-07 HISTORY — PX: VAGINAL PROLAPSE REPAIR: SHX830

## 2016-09-07 HISTORY — PX: CYSTOSCOPY: SHX5120

## 2016-09-07 HISTORY — DX: Acquired absence of both cervix and uterus: Z90.710

## 2016-09-07 HISTORY — PX: VAGINAL HYSTERECTOMY: SHX2639

## 2016-09-07 HISTORY — PX: ANTERIOR AND POSTERIOR REPAIR: SHX5121

## 2016-09-07 LAB — APTT: APTT: 33 s (ref 24–36)

## 2016-09-07 LAB — TYPE AND SCREEN
ABO/RH(D): B POS
Antibody Screen: NEGATIVE

## 2016-09-07 LAB — PROTIME-INR
INR: 0.91
PROTHROMBIN TIME: 12.2 s (ref 11.4–15.2)

## 2016-09-07 LAB — ABO/RH: ABO/RH(D): B POS

## 2016-09-07 SURGERY — HYSTERECTOMY, VAGINAL
Anesthesia: General | Site: Vagina

## 2016-09-07 MED ORDER — SUGAMMADEX SODIUM 200 MG/2ML IV SOLN
INTRAVENOUS | Status: DC | PRN
  Administered 2016-09-07: 112 mg via INTRAVENOUS

## 2016-09-07 MED ORDER — PHENYLEPHRINE 40 MCG/ML (10ML) SYRINGE FOR IV PUSH (FOR BLOOD PRESSURE SUPPORT)
PREFILLED_SYRINGE | INTRAVENOUS | Status: AC
  Filled 2016-09-07: qty 20

## 2016-09-07 MED ORDER — OXYCODONE-ACETAMINOPHEN 5-325 MG PO TABS: 1.0000 | ORAL_TABLET | ORAL | Status: DC | PRN

## 2016-09-07 MED ORDER — HYDROMORPHONE HCL 1 MG/ML IJ SOLN: 0.2000 mg | INTRAMUSCULAR | Status: DC | PRN

## 2016-09-07 MED ORDER — LIDOCAINE-EPINEPHRINE (PF) 1 %-1:200000 IJ SOLN
INTRAMUSCULAR | Status: AC
  Filled 2016-09-07: qty 30

## 2016-09-07 MED ORDER — ONDANSETRON HCL 4 MG/2ML IJ SOLN
INTRAMUSCULAR | Status: AC
  Filled 2016-09-07: qty 2

## 2016-09-07 MED ORDER — LACTATED RINGERS IV SOLN
INTRAVENOUS | Status: DC
  Administered 2016-09-07 (×3): via INTRAVENOUS

## 2016-09-07 MED ORDER — DEXAMETHASONE SODIUM PHOSPHATE 10 MG/ML IJ SOLN
INTRAMUSCULAR | Status: AC
  Filled 2016-09-07: qty 1

## 2016-09-07 MED ORDER — HYDROMORPHONE HCL 1 MG/ML IJ SOLN
INTRAMUSCULAR | Status: AC
  Filled 2016-09-07: qty 1

## 2016-09-07 MED ORDER — FENTANYL CITRATE (PF) 250 MCG/5ML IJ SOLN
INTRAMUSCULAR | Status: AC
  Filled 2016-09-07: qty 5

## 2016-09-07 MED ORDER — PANTOPRAZOLE SODIUM 40 MG PO TBEC
40.0000 mg | DELAYED_RELEASE_TABLET | Freq: Every day | ORAL | Status: DC
  Administered 2016-09-08: 40 mg via ORAL
  Filled 2016-09-07: qty 1

## 2016-09-07 MED ORDER — MIDAZOLAM HCL 2 MG/2ML IJ SOLN
INTRAMUSCULAR | Status: AC
  Filled 2016-09-07: qty 2

## 2016-09-07 MED ORDER — SIMETHICONE 80 MG PO CHEW
80.0000 mg | CHEWABLE_TABLET | Freq: Four times a day (QID) | ORAL | Status: DC | PRN
Start: 2016-09-07 — End: 2016-09-08

## 2016-09-07 MED ORDER — MIDAZOLAM HCL 2 MG/2ML IJ SOLN
INTRAMUSCULAR | Status: DC | PRN
  Administered 2016-09-07 (×2): 1 mg via INTRAVENOUS

## 2016-09-07 MED ORDER — HYDROMORPHONE HCL 1 MG/ML IJ SOLN: 0.2500 mg | INTRAMUSCULAR | Status: DC | PRN

## 2016-09-07 MED ORDER — MEPERIDINE HCL 25 MG/ML IJ SOLN: 6.2500 mg | INTRAMUSCULAR | Status: DC | PRN

## 2016-09-07 MED ORDER — KETOROLAC TROMETHAMINE 30 MG/ML IJ SOLN: 30.0000 mg | Freq: Once | INTRAMUSCULAR | Status: DC

## 2016-09-07 MED ORDER — ESTRADIOL 0.1 MG/GM VA CREA
TOPICAL_CREAM | VAGINAL | Status: DC | PRN
  Administered 2016-09-07: 1 via VAGINAL

## 2016-09-07 MED ORDER — STERILE WATER FOR IRRIGATION IR SOLN
Status: DC | PRN
  Administered 2016-09-07: 300 mL via INTRAVESICAL

## 2016-09-07 MED ORDER — ONDANSETRON HCL 4 MG/2ML IJ SOLN
INTRAMUSCULAR | Status: DC | PRN
  Administered 2016-09-07: 4 mg via INTRAVENOUS

## 2016-09-07 MED ORDER — LIDOCAINE-EPINEPHRINE (PF) 1 %-1:200000 IJ SOLN
INTRAMUSCULAR | Status: DC | PRN
  Administered 2016-09-07: 14 mL
  Administered 2016-09-07: 30 mL
  Administered 2016-09-07: 10 mL

## 2016-09-07 MED ORDER — PROPOFOL 10 MG/ML IV BOLUS
INTRAVENOUS | Status: DC | PRN
  Administered 2016-09-07: 150 mg via INTRAVENOUS

## 2016-09-07 MED ORDER — SUGAMMADEX SODIUM 200 MG/2ML IV SOLN
INTRAVENOUS | Status: AC
  Filled 2016-09-07: qty 2

## 2016-09-07 MED ORDER — PROMETHAZINE HCL 25 MG/ML IJ SOLN: 6.2500 mg | INTRAMUSCULAR | Status: DC | PRN

## 2016-09-07 MED ORDER — PHENYLEPHRINE HCL 10 MG/ML IJ SOLN
INTRAMUSCULAR | Status: DC | PRN
  Administered 2016-09-07 (×2): 80 ug via INTRAVENOUS
  Administered 2016-09-07 (×3): 40 ug via INTRAVENOUS

## 2016-09-07 MED ORDER — BUPIVACAINE HCL (PF) 0.25 % IJ SOLN
INTRAMUSCULAR | Status: AC
  Filled 2016-09-07: qty 30

## 2016-09-07 MED ORDER — ROCURONIUM BROMIDE 100 MG/10ML IV SOLN
INTRAVENOUS | Status: AC
  Filled 2016-09-07: qty 1

## 2016-09-07 MED ORDER — ESTRADIOL 0.1 MG/GM VA CREA
TOPICAL_CREAM | VAGINAL | Status: AC
  Filled 2016-09-07: qty 42.5

## 2016-09-07 MED ORDER — SODIUM CHLORIDE 0.9 % IR SOLN
Freq: Once | Status: AC
  Administered 2016-09-07: 500 mL
  Filled 2016-09-07: qty 500000

## 2016-09-07 MED ORDER — IBUPROFEN 800 MG PO TABS
800.0000 mg | ORAL_TABLET | Freq: Three times a day (TID) | ORAL | Status: DC | PRN
  Administered 2016-09-08: 800 mg via ORAL
  Filled 2016-09-07: qty 1

## 2016-09-07 MED ORDER — LIDOCAINE HCL (CARDIAC) 20 MG/ML IV SOLN
INTRAVENOUS | Status: DC | PRN
  Administered 2016-09-07: 60 mg via INTRAVENOUS

## 2016-09-07 MED ORDER — LIDOCAINE HCL (CARDIAC) 20 MG/ML IV SOLN
INTRAVENOUS | Status: AC
  Filled 2016-09-07: qty 5

## 2016-09-07 MED ORDER — PROPOFOL 10 MG/ML IV BOLUS
INTRAVENOUS | Status: AC
  Filled 2016-09-07: qty 20

## 2016-09-07 MED ORDER — HYDROMORPHONE HCL 1 MG/ML IJ SOLN
INTRAMUSCULAR | Status: DC | PRN
  Administered 2016-09-07: 1 mg via INTRAVENOUS

## 2016-09-07 MED ORDER — ONDANSETRON HCL 4 MG/2ML IJ SOLN: 4.0000 mg | Freq: Four times a day (QID) | INTRAMUSCULAR | Status: DC | PRN

## 2016-09-07 MED ORDER — KETOROLAC TROMETHAMINE 30 MG/ML IJ SOLN
30.0000 mg | Freq: Four times a day (QID) | INTRAMUSCULAR | Status: DC
  Administered 2016-09-07 (×2): 30 mg via INTRAVENOUS
  Filled 2016-09-07 (×2): qty 1

## 2016-09-07 MED ORDER — INFLUENZA VAC SPLIT QUAD 0.5 ML IM SUSY: 0.5000 mL | PREFILLED_SYRINGE | INTRAMUSCULAR | Status: DC

## 2016-09-07 MED ORDER — KETOROLAC TROMETHAMINE 30 MG/ML IJ SOLN: 30.0000 mg | Freq: Four times a day (QID) | INTRAMUSCULAR | Status: DC

## 2016-09-07 MED ORDER — DEXAMETHASONE SODIUM PHOSPHATE 4 MG/ML IJ SOLN
INTRAMUSCULAR | Status: DC | PRN
  Administered 2016-09-07 (×2): 5 mg via INTRAVENOUS

## 2016-09-07 MED ORDER — MENTHOL 3 MG MT LOZG: 1.0000 | LOZENGE | OROMUCOSAL | Status: DC | PRN

## 2016-09-07 MED ORDER — DEXTROSE IN LACTATED RINGERS 5 % IV SOLN
INTRAVENOUS | Status: DC
  Administered 2016-09-07: 15:00:00 via INTRAVENOUS

## 2016-09-07 MED ORDER — ROCURONIUM BROMIDE 100 MG/10ML IV SOLN
INTRAVENOUS | Status: DC | PRN
  Administered 2016-09-07: 35 mg via INTRAVENOUS
  Administered 2016-09-07: 5 mg via INTRAVENOUS
  Administered 2016-09-07: 2 mg via INTRAVENOUS
  Administered 2016-09-07: 5 mg via INTRAVENOUS
  Administered 2016-09-07: 3 mg via INTRAVENOUS
  Administered 2016-09-07: 5 mg via INTRAVENOUS

## 2016-09-07 MED ORDER — ONDANSETRON HCL 4 MG PO TABS: 4.0000 mg | ORAL_TABLET | Freq: Four times a day (QID) | ORAL | Status: DC | PRN

## 2016-09-07 MED ORDER — CEFAZOLIN SODIUM-DEXTROSE 2-4 GM/100ML-% IV SOLN
2.0000 g | INTRAVENOUS | Status: AC
  Administered 2016-09-07: 2 g via INTRAVENOUS

## 2016-09-07 MED ORDER — FENTANYL CITRATE (PF) 100 MCG/2ML IJ SOLN
INTRAMUSCULAR | Status: DC | PRN
  Administered 2016-09-07: 100 ug via INTRAVENOUS
  Administered 2016-09-07: 50 ug via INTRAVENOUS

## 2016-09-07 SURGICAL SUPPLY — 63 items
ALLOGRAFT TUTOPLAST AXIS 6X12 (Tissue) IMPLANT
BLADE SURG 15 STRL LF C SS BP (BLADE) ×3 IMPLANT
BLADE SURG 15 STRL SS (BLADE) ×4
CANISTER SUCT 3000ML (MISCELLANEOUS) ×10 IMPLANT
CATH FOLEY 2WAY SLVR  5CC 16FR (CATHETERS)
CATH FOLEY 2WAY SLVR 5CC 16FR (CATHETERS) IMPLANT
CATH ROBINSON RED A/P 16FR (CATHETERS) IMPLANT
CLOTH BEACON ORANGE TIMEOUT ST (SAFETY) ×4 IMPLANT
CONT PATH 16OZ SNAP LID 3702 (MISCELLANEOUS) ×1 IMPLANT
CONTAINER PREFILL 10% NBF 60ML (FORM) IMPLANT
DECANTER SPIKE VIAL GLASS SM (MISCELLANEOUS) ×2 IMPLANT
DEVICE CAPIO SLIM SINGLE (INSTRUMENTS) ×1 IMPLANT
DRAIN PENROSE 1/4X12 LTX (DRAIN) ×4 IMPLANT
DRAPE STERI URO 9X17 APER PCH (DRAPES) ×4 IMPLANT
DRAPE UNDERBUTTOCKS STRL (DRAPE) ×4 IMPLANT
ELECT LIGASURE SHORT 9 REUSE (ELECTRODE) ×4 IMPLANT
GAUZE PACKING 1 X5 YD ST (GAUZE/BANDAGES/DRESSINGS) ×1 IMPLANT
GAUZE PACKING 2X5 YD STRL (GAUZE/BANDAGES/DRESSINGS) IMPLANT
GAUZE SPONGE 4X4 16PLY XRAY LF (GAUZE/BANDAGES/DRESSINGS) ×8 IMPLANT
GLOVE BIO SURGEON STRL SZ7.5 (GLOVE) ×4 IMPLANT
GLOVE BIO SURGEON STRL SZ8 (GLOVE) ×8 IMPLANT
GLOVE BIOGEL PI IND STRL 6.5 (GLOVE) ×3 IMPLANT
GLOVE BIOGEL PI IND STRL 7.0 (GLOVE) ×9 IMPLANT
GLOVE BIOGEL PI INDICATOR 6.5 (GLOVE) ×1
GLOVE BIOGEL PI INDICATOR 7.0 (GLOVE) ×3
GLOVE ECLIPSE 6.5 STRL STRAW (GLOVE) ×4 IMPLANT
GOWN STRL REUS W/TWL LRG LVL3 (GOWN DISPOSABLE) ×30 IMPLANT
LIQUID BAND (GAUZE/BANDAGES/DRESSINGS) ×2 IMPLANT
NDL MAYO 6 CRC TAPER PT (NEEDLE) IMPLANT
NDL SPNL 22GX3.5 QUINCKE BK (NEEDLE) IMPLANT
NEEDLE HYPO 22GX1.5 SAFETY (NEEDLE) ×4 IMPLANT
NEEDLE MAYO 6 CRC TAPER PT (NEEDLE) ×4 IMPLANT
NEEDLE SPNL 22GX3.5 QUINCKE BK (NEEDLE) ×4 IMPLANT
NS IRRIG 1000ML POUR BTL (IV SOLUTION) ×8 IMPLANT
PACK TRENDGUARD 600 HYBRD PROC (MISCELLANEOUS) IMPLANT
PACK VAGINAL WOMENS (CUSTOM PROCEDURE TRAY) ×4 IMPLANT
PAD OB MATERNITY 4.3X12.25 (PERSONAL CARE ITEMS) ×4 IMPLANT
PENCIL BUTTON HOLSTER BLD 10FT (ELECTRODE) ×2 IMPLANT
PLUG CATH AND CAP STER (CATHETERS) ×4 IMPLANT
RETRACTOR STAY HOOK 5MM (MISCELLANEOUS) ×4 IMPLANT
SET CYSTO W/LG BORE CLAMP LF (SET/KITS/TRAYS/PACK) ×4 IMPLANT
SHEET LAVH (DRAPES) ×4 IMPLANT
SUT CAPIO ETHIBPND (SUTURE) ×2 IMPLANT
SUT SILK 2 0 FS (SUTURE) IMPLANT
SUT VIC AB 0 CT1 27 (SUTURE) ×24
SUT VIC AB 0 CT1 27XBRD ANBCTR (SUTURE) ×8 IMPLANT
SUT VIC AB 0 CT1 27XCR 8 STRN (SUTURE) ×7 IMPLANT
SUT VIC AB 0 CT2 27 (SUTURE) IMPLANT
SUT VIC AB 2-0 CT1 (SUTURE) ×8 IMPLANT
SUT VIC AB 2-0 SH 27 (SUTURE) ×16
SUT VIC AB 2-0 SH 27XBRD (SUTURE) ×12 IMPLANT
SUT VIC AB 3-0 CT1 27 (SUTURE) ×4
SUT VIC AB 3-0 CT1 TAPERPNT 27 (SUTURE) ×3 IMPLANT
SUT VIC AB 3-0 PS2 18 (SUTURE)
SUT VIC AB 3-0 PS2 18XBRD (SUTURE) IMPLANT
SUT VICRYL 0 TIES 12 18 (SUTURE) ×3 IMPLANT
SUT VICRYL 0 UR6 27IN ABS (SUTURE) ×2 IMPLANT
TOWEL OR 17X24 6PK STRL BLUE (TOWEL DISPOSABLE) ×15 IMPLANT
TRAY FOLEY CATH SILVER 14FR (SET/KITS/TRAYS/PACK) ×7 IMPLANT
TRENDGUARD 600 HYBRID PROC PK (MISCELLANEOUS)
TUBING NON-CON 1/4 X 20 CONN (TUBING) ×4 IMPLANT
TUTOPLAST AXIS 6X12 (Tissue) ×4 IMPLANT
WATER STERILE IRR 1000ML POUR (IV SOLUTION) ×6 IMPLANT

## 2016-09-07 NOTE — Anesthesia Postprocedure Evaluation (Signed)
Anesthesia Post Note  Patient: Scientist, clinical (histocompatibility and immunogenetics)  Procedure(s) Performed: Procedure(s) (LRB): HYSTERECTOMY VAGINAL/Bilateral Salpingo-Oophorectomy (Bilateral) REPAIR ANTERIOR AND  , VAULT PROLAPSE AND GRAFT (N/A) CYSTOSCOPY (N/A) PROLAPSE VAULT SUSPENSION GRAFT (N/A)  Patient location during evaluation: [facility] Anesthesia Type: General Level of consciousness: awake and alert and oriented Pain management: pain level controlled Vital Signs Assessment: post-procedure vital signs reviewed and stable Respiratory status: spontaneous breathing Cardiovascular status: stable Postop Assessment: no headache, no backache, no signs of nausea or vomiting, patient able to bend at knees and adequate PO intake Anesthetic complications: no     Last Vitals:  Vitals:   09/07/16 1352 09/07/16 1700  BP: 117/60 115/64  Pulse: 68   Resp: 16 16  Temp: 36.7 C 36.7 C    Last Pain:  Vitals:   09/07/16 1700  TempSrc: Oral  PainSc:    Pain Goal: Patients Stated Pain Goal: 3 (09/07/16 1520)               Tanyah Debruyne J

## 2016-09-07 NOTE — Telephone Encounter (Signed)
Please have the patient come in for an appointment to discuss the health maintenance concerns.

## 2016-09-07 NOTE — Addendum Note (Signed)
Addendum  created 09/07/16 1921 by Enis Slipper, CRNA   Sign clinical note

## 2016-09-07 NOTE — Anesthesia Procedure Notes (Signed)
Procedure Name: Intubation Date/Time: 09/07/2016 7:53 AM Performed by: Flossie Dibble Pre-anesthesia Checklist: Patient identified, Timeout performed, Emergency Drugs available, Suction available and Patient being monitored Patient Re-evaluated:Patient Re-evaluated prior to inductionOxygen Delivery Method: Circle system utilized Preoxygenation: Pre-oxygenation with 100% oxygen Intubation Type: IV induction Ventilation: Mask ventilation without difficulty Laryngoscope Size: Mac and 3 Grade View: Grade I Tube type: Oral Tube size: 7.0 mm Number of attempts: 1 Airway Equipment and Method: Stylet Placement Confirmation: ETT inserted through vocal cords under direct vision,  breath sounds checked- equal and bilateral and positive ETCO2 Secured at: 19 cm Tube secured with: Tape Dental Injury: Teeth and Oropharynx as per pre-operative assessment

## 2016-09-07 NOTE — Brief Op Note (Signed)
09/07/2016  10:46 AM  PATIENT:  Nancy Farley  60 y.o. female  PRE-OPERATIVE DIAGNOSIS:  Uterovaginal Prolapse, cystocele, small rectocele  POST-OPERATIVE DIAGNOSIS:  Uterovaginal Prolapse, cystocele, small rectocele PROCEDURE:  Procedure(s): HYSTERECTOMY VAGINAL/Bilateral Salpingo-Oophorectomy (Bilateral) REPAIR ANTERIOR AND  , VAULT PROLAPSE AND GRAFT (N/A) CYSTOSCOPY (N/A) PROLAPSE VAULT SUSPENSION GRAFT (N/A)  SURGEON:  Surgeon(s) and Role: Panel 1:    * Servando Salina, MD - Primary  Panel 2:    * Bjorn Loser, MD - Primary  PHYSICIAN ASSISTANT:   ASSISTANTS: Bjorn Loser, M.D.   ANESTHESIA:   general Findings, uterine prolapse, cystocele, small rectocele, atrophic ovaries, small uterus, nl tubes EBL:  Total I/O In: 2000 [I.V.:2000] Out: 525 [Urine:500; Blood:25]  BLOOD ADMINISTERED:none  DRAINS: none   LOCAL MEDICATIONS USED:  OTHER lidocaine with epinephrine  SPECIMEN:  Source of Specimen:  uterus with cervix, tubes and ovaries  DISPOSITION OF SPECIMEN:  PATHOLOGY  COUNTS:  YES  TOURNIQUET:  * No tourniquets in log *  DICTATION: .Other Dictation: Dictation Number [id number]  PLAN OF CARE: Admit for overnight observation  PATIENT DISPOSITION:  PACU - hemodynamically stable.   Delay start of Pharmacological VTE agent (>24hrs) due to surgical blood loss or risk of bleeding: no

## 2016-09-07 NOTE — Anesthesia Postprocedure Evaluation (Signed)
Anesthesia Post Note  Patient: Scientist, clinical (histocompatibility and immunogenetics)  Procedure(s) Performed: Procedure(s) (LRB): HYSTERECTOMY VAGINAL/Bilateral Salpingo-Oophorectomy (Bilateral) REPAIR ANTERIOR AND  , VAULT PROLAPSE AND GRAFT (N/A) CYSTOSCOPY (N/A) PROLAPSE VAULT SUSPENSION GRAFT (N/A)  Anesthesia Post Evaluation   Last Vitals:  Vitals:   09/07/16 1352 09/07/16 1700  BP: 117/60 115/64  Pulse: 68   Resp: 16 16  Temp: 36.7 C 36.7 C    Last Pain:  Vitals:   09/07/16 1700  TempSrc: Oral  PainSc:    Pain Goal: Patients Stated Pain Goal: 3 (09/07/16 1520)               Anneke Cundy J

## 2016-09-07 NOTE — Anesthesia Preprocedure Evaluation (Addendum)
Anesthesia Evaluation  Patient identified by MRN, date of birth, ID band Patient awake    Reviewed: Allergy & Precautions, H&P , NPO status , Patient's Chart, lab work & pertinent test results  Airway Mallampati: I  TM Distance: >3 FB Neck ROM: full    Dental no notable dental hx. (+) Teeth Intact   Pulmonary neg pulmonary ROS,    Pulmonary exam normal        Cardiovascular negative cardio ROS Normal cardiovascular exam     Neuro/Psych negative neurological ROS  negative psych ROS   GI/Hepatic Neg liver ROS,   Endo/Other    Renal/GU negative Renal ROS     Musculoskeletal   Abdominal Normal abdominal exam  (+)   Peds  Hematology negative hematology ROS (+)   Anesthesia Other Findings   Reproductive/Obstetrics negative OB ROS                            Anesthesia Physical Anesthesia Plan  ASA: II  Anesthesia Plan: General   Post-op Pain Management:    Induction:   Airway Management Planned: LMA  Additional Equipment:   Intra-op Plan:   Post-operative Plan:   Informed Consent: I have reviewed the patients History and Physical, chart, labs and discussed the procedure including the risks, benefits and alternatives for the proposed anesthesia with the patient or authorized representative who has indicated his/her understanding and acceptance.   Dental Advisory Given  Plan Discussed with: CRNA and Surgeon  Anesthesia Plan Comments: (Proseal LMA)        Anesthesia Quick Evaluation

## 2016-09-07 NOTE — Op Note (Signed)
Preoperative diagnosis: Vault prolapse and cystocele and small rectocele Postoperative diagnosis: Vault prolapse and cystocele and small rectocele Surgery: Vault prolapse repair and cystocele repair and graft and cystoscopy Surgeon: Dr. Nicki Reaper Lanisa Ishler Assistant: Dr. Catalina Gravel cousins  The patient has the above diagnosis and consented to the above procedure. Extra care was taken with leg positioning to minimize the risk of compartment syndrome and neuropathy and deep vein thrombosis. Preoperative antibiotics were given. The patient had a grade 3 cystocele with central defect with loss of uterine support. She had minimal rectocele under anesthesia. She had a long anterior vaginal wall  Hysterectomy was performed by Dr. cousins. The cuff was left open with the ureteral sacral ligaments tagged. The posterior cuff had been run with running suture  I instilled approximately 25 mL of a lidocaine epinephrine mixture underneath the anterior vaginal wall in the appropriate plane. Utilizing Allis clamps I made a long T-shaped incision to the bladder neck. I sharply mobilized  the overlying vaginal wall mucosa from the underlying pupil cervical fascia to the white line bilaterally. I was pleased with the mobilization at the cuff. The ureteral sacral ligaments were tagged.  I did a 2 layer anterior repair with running 2-0 Vicryl on an SH needle. I did not imbricate the bladder neck. It was in anatomic repair keeping plenty of length and not distorting the anatomy  I cystoscoped the patient. There was no distortion of the trigone. Cystocele repair looked very good. There was excellent efflux bilaterally. Cystoscopy had initially been performed after the hysterectomy as well with the same findings  In the appropriate plane I finger dissected to the ischail spine bilaterally. I mobilized soft tissues medially. With a Capio device I placed a 0 Ethibond 1 full finger breath medial to the spine in a straight line  between the spines.  I did a digital rectal examination and the sutures were in excellent position. There is no rectal injury. This was double checked. The position of the sutures was double checked  A 12 x 6 dermal graft was prepared in the 10 x 6. It was shaped as a trapezoid.  With my usual technique I mobilized at the urethrovesical angle and placed a 0 Vicryl at that level. The graft was laid in tension-free  I trimmed an appropriate amount of anterior vaginal wall and closed the anterior vaginal wall with running 2-0 Vicryl and CT1 needle  Dr. cousins closed the cuff  I spent a few minutes examining her rectocele. Visually she had no rectocele. On digital rectal examination she had a mild rectocele with thinning of the tissue with moderate elasticity of tissue. I did not feel she needed a rectocele repair. It was minimal on the last visit as it was with Dr. cousins.  At the end of the case she had excellent length. She had very good support anteriorly. Vaginal pack with Estrace cream was applied. Urine output was excellent. Blood loss was less than 100 mL. Leg position was good  Hopefully the patient will reach her treatment goal

## 2016-09-07 NOTE — Anesthesia Postprocedure Evaluation (Signed)
Anesthesia Post Note  Patient: Scientist, clinical (histocompatibility and immunogenetics)  Procedure(s) Performed: Procedure(s) (LRB): HYSTERECTOMY VAGINAL/Bilateral Salpingo-Oophorectomy (Bilateral) REPAIR ANTERIOR AND  , VAULT PROLAPSE AND GRAFT (N/A) CYSTOSCOPY (N/A) PROLAPSE VAULT SUSPENSION GRAFT (N/A)  Patient location during evaluation: PACU Anesthesia Type: General Level of consciousness: sedated Pain management: pain level controlled Vital Signs Assessment: post-procedure vital signs reviewed and stable Respiratory status: spontaneous breathing Cardiovascular status: stable Postop Assessment: no signs of nausea or vomiting Anesthetic complications: no     Last Vitals:  Vitals:   09/07/16 1115 09/07/16 1130  BP: 123/68 122/65  Pulse: 75 68  Resp: 15 (!) 8  Temp:      Last Pain:  Vitals:   09/07/16 1145  TempSrc:   PainSc: 0-No pain   Pain Goal: Patients Stated Pain Goal: 3 (09/07/16 1145)               [place]

## 2016-09-07 NOTE — Transfer of Care (Signed)
Immediate Anesthesia Transfer of Care Note  Patient: Nancy Farley  Procedure(s) Performed: Procedure(s): HYSTERECTOMY VAGINAL/Bilateral Salpingo-Oophorectomy (Bilateral) REPAIR ANTERIOR AND  , VAULT PROLAPSE AND GRAFT (N/A) CYSTOSCOPY (N/A) PROLAPSE VAULT SUSPENSION GRAFT (N/A)  Patient Location: PACU  Anesthesia Type:General  Level of Consciousness: awake, alert  and oriented  Airway & Oxygen Therapy: Patient Spontanous Breathing and Patient connected to nasal cannula oxygen  Post-op Assessment: Report given to RN and Post -op Vital signs reviewed and stable  Post vital signs: Reviewed and stable  Last Vitals:  Vitals:   09/07/16 0630 09/07/16 1104  BP: 116/76 120/68  Pulse: 74 80  Resp: 18 (!) 7  Temp: 36.7 C     Last Pain:  Vitals:   09/07/16 0630  TempSrc: Oral         Complications: No apparent anesthesia complications

## 2016-09-07 NOTE — Telephone Encounter (Addendum)
Unable to LVM, sent mychart message

## 2016-09-07 NOTE — Addendum Note (Signed)
Addendum  created 09/07/16 1926 by Enis Slipper, CRNA   Sign clinical note

## 2016-09-08 ENCOUNTER — Encounter (HOSPITAL_COMMUNITY): Payer: Self-pay | Admitting: Obstetrics and Gynecology

## 2016-09-08 DIAGNOSIS — N814 Uterovaginal prolapse, unspecified: Secondary | ICD-10-CM | POA: Diagnosis not present

## 2016-09-08 LAB — CBC
HEMATOCRIT: 27.2 % — AB (ref 36.0–46.0)
HEMOGLOBIN: 9.6 g/dL — AB (ref 12.0–15.0)
MCH: 31.2 pg (ref 26.0–34.0)
MCHC: 35.3 g/dL (ref 30.0–36.0)
MCV: 88.3 fL (ref 78.0–100.0)
PLATELETS: 295 10*3/uL (ref 150–400)
RBC: 3.08 MIL/uL — AB (ref 3.87–5.11)
RDW: 13.3 % (ref 11.5–15.5)
WBC: 14 10*3/uL — AB (ref 4.0–10.5)

## 2016-09-08 LAB — BASIC METABOLIC PANEL
ANION GAP: 6 (ref 5–15)
BUN: 14 mg/dL (ref 6–20)
CHLORIDE: 106 mmol/L (ref 101–111)
CO2: 24 mmol/L (ref 22–32)
Calcium: 8.3 mg/dL — ABNORMAL LOW (ref 8.9–10.3)
Creatinine, Ser: 0.59 mg/dL (ref 0.44–1.00)
GFR calc Af Amer: >60 mL/min (ref >60)
GLUCOSE: 144 mg/dL — AB (ref 65–99)
Potassium: 4 mmol/L (ref 3.5–5.1)
Sodium: 136 mmol/L (ref 135–145)

## 2016-09-08 MED ORDER — HYDROCODONE-ACETAMINOPHEN 5-325 MG PO TABS: 1.0000 | ORAL_TABLET | Freq: Four times a day (QID) | ORAL | 0 refills | Status: DC | PRN

## 2016-09-08 MED ORDER — IBUPROFEN 800 MG PO TABS
800.0000 mg | ORAL_TABLET | Freq: Three times a day (TID) | ORAL | 0 refills | Status: DC | PRN
Start: 2016-09-08 — End: 2020-04-15

## 2016-09-08 NOTE — Progress Notes (Signed)
Vitals ok this am Mobilizing well Hb decreased as noted Voiding trial Instructions detailed

## 2016-09-08 NOTE — Progress Notes (Signed)
1 Day Post-Op Procedure(s) (LRB): HYSTERECTOMY VAGINAL/Bilateral Salpingo-Oophorectomy (Bilateral) REPAIR ANTERIOR AND  , VAULT PROLAPSE AND GRAFT (N/A) CYSTOSCOPY (N/A) PROLAPSE VAULT SUSPENSION GRAFT (N/A)  Subjective: Patient reports tolerating PO.  Lower abdominal soreness only. Used on toradol for pain/discomfort  Objective: I have reviewed patient's vital signs, intake and output and labs.  General: alert, cooperative and no distress Resp: clear to auscultation bilaterally Cardio: regular rate and rhythm, S1, S2 normal, no murmur, click, rub or gallop GI: soft, non-tender; bowel sounds normal; no masses,  no organomegaly Extremities: no edema, redness or tenderness in the calves or thighs Vaginal Bleeding: minimal CBC    Component Value Date/Time   WBC 14.0 (H) 09/08/2016 0631   RBC 3.08 (L) 09/08/2016 0631   HGB 9.6 (L) 09/08/2016 0631   HCT 27.2 (L) 09/08/2016 0631   PLT 295 09/08/2016 0631   MCV 88.3 09/08/2016 0631   MCH 31.2 09/08/2016 0631   MCHC 35.3 09/08/2016 0631   RDW 13.3 09/08/2016 0631   LYMPHSABS 1.4 02/02/2015 0946   MONOABS 0.7 02/02/2015 0946   EOSABS 0.2 02/02/2015 0946   BASOSABS 0.0 02/02/2015 0946   BMP Latest Ref Rng & Units 09/08/2016 09/05/2016  Glucose 65 - 99 mg/dL 144(H) 103(H)  BUN 6 - 20 mg/dL 14 15  Creatinine 0.44 - 1.00 mg/dL 0.59 0.68  Sodium 135 - 145 mmol/L 136 137  Potassium 3.5 - 5.1 mmol/L 4.0 4.2  Chloride 101 - 111 mmol/L 106 101  CO2 22 - 32 mmol/L 24 28  Calcium 8.9 - 10.3 mg/dL 8.3(L) 9.1   Assessment: s/p Procedure(s): HYSTERECTOMY VAGINAL/Bilateral Salpingo-Oophorectomy (Bilateral) REPAIR ANTERIOR AND  , VAULT PROLAPSE AND GRAFT (N/A) CYSTOSCOPY (N/A) PROLAPSE VAULT SUSPENSION GRAFT (N/A): stable, progressing well and tolerating diet  Plan: Encourage ambulation Discontinue IV fluids Discharge home voiding trial prior to d/c home   d/c instructions reviewed F/u 6 wks Dr Garwin Brothers , Dr Matilde Sprang 11/10   LOS:  0 days    Arilyn Brierley A 09/08/2016, 9:01 AM

## 2016-09-08 NOTE — Discharge Summary (Signed)
Physician Discharge Summary  Patient ID: Nancy Farley MRN: [id number] DOB/AGE: [birth year] 60 y.o.  Admit date: 09/07/2016 Discharge date: 09/08/2016  Admission Diagnoses: uterovaginal prolapse, cystocele, rectocele  Discharge Diagnoses: uterovaginal prolapse, cystocele, rectocele Active Problems:   S/P hysterectomy with oophorectomy s/p vault prolapse repair, cystocele repair, cystoscopy McCall culdeplasty  Discharged Condition: stable  Hospital Course: pt underwent TVHBSO, vault prolapse suspension with graft, anterior repair and cystoscopy. Uncomplicated postoperative course  Consults: None  Significant Diagnostic Studies: labs:  CBC    Component Value Date/Time   WBC 14.0 (H) 09/08/2016 0631   RBC 3.08 (L) 09/08/2016 0631   HGB 9.6 (L) 09/08/2016 0631   HCT 27.2 (L) 09/08/2016 0631   PLT 295 09/08/2016 0631   MCV 88.3 09/08/2016 0631   MCH 31.2 09/08/2016 0631   MCHC 35.3 09/08/2016 0631   RDW 13.3 09/08/2016 0631   LYMPHSABS 1.4 02/02/2015 0946   MONOABS 0.7 02/02/2015 0946   EOSABS 0.2 02/02/2015 0946   BASOSABS 0.0 02/02/2015 0946   BMP Latest Ref Rng & Units 09/08/2016 09/05/2016  Glucose 65 - 99 mg/dL 144(H) 103(H)  BUN 6 - 20 mg/dL 14 15  Creatinine 0.44 - 1.00 mg/dL 0.59 0.68  Sodium 135 - 145 mmol/L 136 137  Potassium 3.5 - 5.1 mmol/L 4.0 4.2  Chloride 101 - 111 mmol/L 106 101  CO2 22 - 32 mmol/L 24 28  Calcium 8.9 - 10.3 mg/dL 8.3(L) 9.1    Treatments: surgery: TVHBSO, vaginal vault suspension, ant repair, cystoscopy  Discharge Exam: Blood pressure (!) 107/50, pulse 98, temperature 98.5 F (36.9 C), temperature source Oral, resp. rate 16, height 5\' 3"  (1.6 m), weight 56.2 kg (124 lb), SpO2 98 %. General appearance: alert, cooperative and no distress Back: no tenderness to percussion or palpation Resp: clear to auscultation bilaterally Cardio: regular rate and rhythm, S1, S2 normal, no murmur, click, rub or gallop GI: soft, non-tender; bowel sounds  normal; no masses,  no organomegaly Pelvic: deferred Extremities: no edema, redness or tenderness in the calves or thighs Skin: Skin color, texture, turgor normal. No rashes or lesions Pad scant blood  Disposition: Final discharge disposition not confirmed  Discharge Instructions    Call MD for:  persistant nausea and vomiting    Complete by:  As directed    Call MD for:  temperature >100.4    Complete by:  As directed    Diet general    Complete by:  As directed    Discharge instructions    Complete by:  As directed    Call if temperature greater than equal to 100.4, nothing per vagina for 4-6 weeks or severe nausea vomiting, increased incisional pain ,  no straining with bowel movements, showers no bath   Discharge patient    Complete by:  As directed    If ok per Dr Matilde Sprang  Voiding trial   Increase activity slowly    Complete by:  As directed    No wound care    Complete by:  As directed        Medication List    STOP taking these medications   THERAPEUTIC-M tablet     TAKE these medications   calcium carbonate 1250 (500 Ca) MG tablet Commonly known as:  OS-CAL - dosed in mg of elemental calcium Take 1 tablet by mouth.   cholecalciferol 400 units Tabs tablet Commonly known as:  VITAMIN D Take 400 Units by mouth.   HYDROcodone-acetaminophen 5-325 MG tablet Commonly known as:  NORCO/VICODIN  Take 1 tablet by mouth every 6 (six) hours as needed for moderate pain.   ibuprofen 800 MG tablet Commonly known as:  ADVIL,MOTRIN Take 1 tablet (800 mg total) by mouth every 8 (eight) hours as needed (mild pain).      Follow-up Information    Kinney Sackmann A, MD Follow up in 6 week(s).   Specialty:  Obstetrics and Gynecology Contact information: [address] [place] [address] [phone]        MACDIARMID,SCOTT A, MD Follow up on 09/15/2016.   Specialty:  Urology Contact information: [place]  [address] [phone]            Signed: Jayne Peckenpaugh A 09/08/2016, 9:07 AM

## 2016-09-08 NOTE — Progress Notes (Addendum)
Spoke to Dr. Matilde Sprang and updated on the patient's status, I/O, and post void residual. Dr. Matilde Sprang cleared pt for discharge.

## 2016-09-08 NOTE — Op Note (Signed)
Nancy Farley, Nancy Farley                ACCOUNT NO.:  [id number]  MEDICAL RECORD NO.:  [id number]  LOCATION:  9302                          FACILITY:  [facility]  PHYSICIAN:  Servando Salina, M.D.DATE OF BIRTH:  1956-02-24  DATE OF PROCEDURE:  09/07/2016 DATE OF DISCHARGE:  09/08/2016                              OPERATIVE REPORT   PREOPERATIVE DIAGNOSES:  Uterovaginal prolapse, cystocele, rectocele.  PROCEDURES:  Total vaginal hysterectomy, bilateral salpingo- oophorectomy.  POSTOPERATIVE DIAGNOSES:  Uterovaginal prolapse, cystocele, rectocele.  ANESTHESIA:  General.  SURGEON:  Servando Salina, MD.  ASSISTANTReece Packer, MD.  DESCRIPTION OF PROCEDURE:  Under adequate general anesthesia, the patient was placed in the dorsal lithotomy position.  She was sterilely prepped and draped in usual fashion.  Her legs have been positioned prior to draping the patient.  A weighted speculum was placed in the vagina.  Sims retractor was placed anteriorly.  The cervix was identified.  There was a posterior vaginal focal erosion due to the patient's use of a pessary up to the previous 2 days.  The anterior lip of the cervix was grasped with a Yates Decamp.  The posterior lip was grasped with a single-tooth tenaculum.  A Foley catheter was then placed with the bag removed and a cap placement.  The cervicovaginal junction was identified.  1% lidocaine with epinephrine was injected circumferentially at that junction.  A circumferential incision was then made and the posterior cul-de-sac was then opened transversely.  The vaginal cuff was then oversewn with 0 Vicryl running lock stitch and the weighted speculum was then repositioned into the pelvis through the posterior vaginal opening.  At that point, the uterosacrals were bilaterally clamped, cut, and suture ligated with 0 Vicryl sutures intact.  Attention was then turned to the anterior cul-de-sac.  The anterior vaginal wall was then  sharply dissected off the uterus and the bladder peritoneum was then entered and the bladder blade placed superiorly.  At that point, the LigaSure was then used to bilaterally clamp, cauterize, and cut the cardinal ligaments, uterine vessels until the round ligament was reached.  At that point, on the patient's left, the sidewall retractors with good exposure, and displaced superiorly. The infundibulopelvic ligament was serially clamped, cauterized, and then cut with removal of the left tube and ovary.  Then, other side was identified and similar procedure was performed with subsequent removal of the uterus with both tubes and ovaries.  At that point, cystoscope was then performed by Dr. Matilde Sprang, who confirmed good jets seen out of both ureters.  He then performed his vaginal vault suspension with cystocele repair.  Please see his operative report for the dictation. At the completion of the cystocele repair as well as the vaginal suspension, the vaginal cuff was then closed in a transverse fashion, after performing a McCall culdoplasty with using 0 Vicryl suture, tying the uterosacral ligaments in the midline, and closing the vaginal cuff altogether.  Rectocele was not performed.  The procedure was then terminated by removing all instruments and packing the vagina with estrogen-soaked packing.  SPECIMENS:  Uterus, cervix, with tubes and ovaries, sent to Pathology.  ESTIMATED BLOOD LOSS:  25 mL.  INTRAOPERATIVE FLUID:  2 L.  URINE OUTPUT:  500 mL.  COUNTS:  Sponge and instrument count x2 was correct.  COMPLICATION:  None.  The patient tolerated the procedure well, was transferred to recovery room in stable condition.     Servando Salina, M.D.     [place]/MEDQ  D:  09/07/2016  T:  09/08/2016  Job:  [id number]

## 2016-09-08 NOTE — Progress Notes (Signed)
Patient discharged home with son. Discharge paperwork and instructions reviewed with patient and family. Prescription given to patient. Follow-up appts reviewed. No questions at this time.

## 2016-10-10 ENCOUNTER — Telehealth: Payer: Self-pay | Admitting: Medical

## 2016-10-10 NOTE — Telephone Encounter (Signed)
For clarification. The urine culture result was based on ua dip that was done on day of service for her physical. Her urine had 3 + infection fighting cells present. With such a urine getting culture was medically indicated although the ua dip was done for physical. Can't ignore finding on screening urine dip.  Pt later had some ancillary studies. I documented as phone note but I actually saw her in the office. She was at pharmacy and there was a question from pharmacist about her taking antibiotic for the bacteria found in her urine on culture. But it had potential to effect her malaria vaccine. I asked pt to come up and let me talk with her directly regarding her culture results, and antibiotic that could potentially decrease effectiveness of the malaria vaccine. During that conversation she mentioned a foul odor. With such a complaint I ordered BV and other ancillary studies. I did not charge patient office visit.(Although I guess technically I could have since took about 15-20 minutes?) See 07-24-2016 phone note.  Pt later  this year had total vaginal hysterectomy, vaginal vault suspension, ant repair and cystoscopy.(imagine cost of these were a lot)  Again I am willing to waive charges for my studies? Can we give her gift card to offset? I don't even know how much urine studies I  ordered cost that I placed. Just thought medically necessary.

## 2016-10-10 NOTE — Telephone Encounter (Signed)
° °  [place]  Cc: Tasia Catchings, [place]  Phone Number: [phone]        Patient checking on status of message below, patient upset no one has called her back, please advise   Previous Messages    ----- Message -----  From: Oneta Rack  Sent: 10/06/2016  9:25 AM  To: Melina Modena, CMA  Subject: Bill                       Patient called regarding 07/19/16 date of service patient received a [facility] hospital bill for $371.52 due to urine collection. Patient picked up Rx from office 07/24/16 and as per patient PCP advised to collect another urine sample.patient is disputing why another urine sample was needed and would like bill waived,  as per patient 2nd urine collection was unnecessary. Please advise      HOSPITAL ACCOUNT NOTE:  Called Patient who was very upset with the amount of the charge for the urine sample she gave at the outpatient facility. She stated she already had done this at the MD office on 07/19/16 and only had a small copay, yet the other was charging much more. Advised of hospital charges and she stated this wasn't in the hospital and she shouldn't have had to provide the specimen again and the PA gave her the wrong medication in the first place and wrong care. Referred her to patient experience, however, advised we aren't going to adjust bill as services were provided. She stated she won't pay the bill.

## 2016-10-10 NOTE — Telephone Encounter (Signed)
I called patient back regarding messages below.  Patient would like to know why the urine urine Cytology was completed. Patient says she did not need this and treatment was not effective so she wants this waived. Informed patient I would reach out to provider to see reason for the order and follow up with her.

## 2016-10-17 NOTE — Telephone Encounter (Signed)
Just wanted update on if we were able to write off the cost of test that pt was upset about. I am willing to talk to billing office if you want me to.

## 2016-10-19 NOTE — Telephone Encounter (Signed)
Called patient back informed her i'm still working to get the bill resolved. Will follow up with her next week with outcome.

## 2016-10-19 NOTE — Telephone Encounter (Signed)
Patient called to follow up on call made last week. Plse call

## 2020-01-01 ENCOUNTER — Ambulatory Visit: Payer: Medicare Other | Attending: Internal Medicine

## 2020-01-01 DIAGNOSIS — Z23 Encounter for immunization: Secondary | ICD-10-CM

## 2020-01-01 NOTE — Progress Notes (Signed)
   Covid-19 Vaccination Clinic  Name:  Nancy Farley    MRN: [id number] DOB: 1956/03/10  01/01/2020  [name] was observed post Covid-19 immunization for 15 minutes without incidence. She was provided with Vaccine Information Sheet and instruction to access the V-Safe system.   [name] was instructed to call 911 with any severe reactions post vaccine: Marland Kitchen Difficulty breathing  . Swelling of your face and throat  . A fast heartbeat  . A bad rash all over your body  . Dizziness and weakness    Immunizations Administered    Name Date Dose VIS Date Route   Pfizer COVID-19 Vaccine 01/01/2020 10:54 AM 0.3 mL 10/17/2019 Intramuscular   Manufacturer: [place]   Lot: [id number]   [place]: [id number]

## 2020-01-21 ENCOUNTER — Ambulatory Visit: Payer: Medicare Other | Attending: Internal Medicine

## 2020-01-21 DIAGNOSIS — Z23 Encounter for immunization: Secondary | ICD-10-CM

## 2020-01-21 NOTE — Progress Notes (Signed)
   Covid-19 Vaccination Clinic  Name:  Nancy Farley    MRN: [id number] DOB: December 27, 1955  01/21/2020  [name] was observed post Covid-19 immunization for 15 minutes without incident. She was provided with Vaccine Information Sheet and instruction to access the V-Safe system.   [name] was instructed to call 911 with any severe reactions post vaccine: Marland Kitchen Difficulty breathing  . Swelling of face and throat  . A fast heartbeat  . A bad rash all over body  . Dizziness and weakness   Immunizations Administered    Name Date Dose VIS Date Route   Pfizer COVID-19 Vaccine 01/21/2020  1:19 PM 0.3 mL 10/17/2019 Intramuscular   Manufacturer: [place]   Lot: [id number]   [place]: [id number]

## 2020-04-15 ENCOUNTER — Other Ambulatory Visit: Payer: Self-pay

## 2020-04-15 ENCOUNTER — Ambulatory Visit (INDEPENDENT_AMBULATORY_CARE_PROVIDER_SITE_OTHER): Payer: Medicare Other | Admitting: Family Medicine

## 2020-04-15 ENCOUNTER — Encounter: Payer: Self-pay | Admitting: Family Medicine

## 2020-04-15 VITALS — BP 81/60 | HR 62 | Temp 97.7°F | Ht 61.25 in | Wt 126.6 lb

## 2020-04-15 DIAGNOSIS — Z1231 Encounter for screening mammogram for malignant neoplasm of breast: Secondary | ICD-10-CM | POA: Diagnosis not present

## 2020-04-15 DIAGNOSIS — M810 Age-related osteoporosis without current pathological fracture: Secondary | ICD-10-CM | POA: Diagnosis not present

## 2020-04-15 DIAGNOSIS — Z23 Encounter for immunization: Secondary | ICD-10-CM | POA: Diagnosis not present

## 2020-04-15 DIAGNOSIS — Z8639 Personal history of other endocrine, nutritional and metabolic disease: Secondary | ICD-10-CM

## 2020-04-15 DIAGNOSIS — Z13220 Encounter for screening for lipoid disorders: Secondary | ICD-10-CM

## 2020-04-15 DIAGNOSIS — L309 Dermatitis, unspecified: Secondary | ICD-10-CM

## 2020-04-15 MED ORDER — TRIAMCINOLONE ACETONIDE 0.1 % EX CREA: 1.0000 "application " | TOPICAL_CREAM | Freq: Two times a day (BID) | CUTANEOUS | 1 refills | Status: AC

## 2020-04-15 NOTE — Progress Notes (Signed)
Nancy Farley is a 64 y.o. female  Chief Complaint  Patient presents with  . Establish Care    Pt here for physical.  Pt c/o rash on both leg chins. It has been 2 years for mammogram and pap smear, Dr. Garwin Brothers.  Pt has had the stoll test, Cologard x 43yrs.    HPI: Nancy Farley is a 64 y.o. female here as a new patient to establish care and for annual exam and f/u on chronic medical issues. Previous PCP Madie Reno, PA at [facility].  She follows with Dr. Garwin Brothers for GYN.   Pt c/o B/L lower legs Rt >Lt x 7-8 mo. Itchy at times. Not painful. Area of involvement has increased over time. Pt states pt feels dry. She has tried almond oil.  She had appt end of this month with Derm (unsure of who).   Last PAP: s/p TAH Last mammo: 2 years ago Last Dexa: due Last colonoscopy: Cologuard 2 years ago - normal/negative - due in 2022   Past Medical History:  Diagnosis Date  . GERD (gastroesophageal reflux disease)   . Hypothyroidism   . Insomnia   . Joint pain   . Leg cramps   . Osteoporosis   . Scoliosis     Past Surgical History:  Procedure Laterality Date  . ANTERIOR AND POSTERIOR REPAIR N/A 09/07/2016   Procedure: REPAIR ANTERIOR AND  , VAULT PROLAPSE AND GRAFT;  Surgeon: Bjorn Loser, MD;  Location: [facility];  Service: Urology;  Laterality: N/A;  . CYSTOSCOPY N/A 09/07/2016   Procedure: CYSTOSCOPY;  Surgeon: Bjorn Loser, MD;  Location: [facility];  Service: Urology;  Laterality: N/A;  . VAGINAL HYSTERECTOMY Bilateral 09/07/2016   Procedure: HYSTERECTOMY VAGINAL/Bilateral Salpingo-Oophorectomy;  Surgeon: Servando Salina, MD;  Location: [facility];  Service: Gynecology;  Laterality: Bilateral;  . VAGINAL PROLAPSE REPAIR N/A 09/07/2016   Procedure: PROLAPSE VAULT SUSPENSION GRAFT;  Surgeon: Bjorn Loser, MD;  Location: [facility];  Service: Urology;  Laterality: N/A;    Social History   Socioeconomic History  . Marital status: Married    Spouse name: Not on file  .  Number of children: Not on file  . Years of education: Not on file  . Highest education level: Not on file  Occupational History  . Not on file  Tobacco Use  . Smoking status: Never Smoker  . Smokeless tobacco: Never Used  Substance and Sexual Activity  . Alcohol use: No    Alcohol/week: 0.0 standard drinks  . Drug use: No  . Sexual activity: Not on file  Other Topics Concern  . Not on file  Social History Narrative  . Not on file   Social Determinants of Health   Financial Resource Strain:   . Difficulty of Paying Living Expenses:   Food Insecurity:   . Worried About Charity fundraiser in the Last Year:   . Arboriculturist in the Last Year:   Transportation Needs:   . Film/video editor (Medical):   Marland Kitchen Lack of Transportation (Non-Medical):   Physical Activity:   . Days of Exercise per Week:   . Minutes of Exercise per Session:   Stress:   . Feeling of Stress :   Social Connections:   . Frequency of Communication with Friends and Family:   . Frequency of Social Gatherings with Friends and Family:   . Attends Religious Services:   . Active Member of Clubs or Organizations:   . Attends Club or  Organization Meetings:   Marland Kitchen Marital Status:   Intimate Partner Violence:   . Fear of Current or Ex-Partner:   . Emotionally Abused:   Marland Kitchen Physically Abused:   . Sexually Abused:     Family History  Problem Relation Age of Onset  . Asthma Mother   . Heart disease Father      Immunization History  Administered Date(s) Administered  . Hepatitis A, Adult 07/19/2016  . Influenza Split 11/23/2011  . PFIZER SARS-COV-2 Vaccination 01/01/2020, 01/21/2020    Outpatient Encounter Medications as of 04/15/2020  Medication Sig  . b complex vitamins tablet Take 1 tablet by mouth daily.  . calcium carbonate (OS-CAL - DOSED IN MG OF ELEMENTAL CALCIUM) 1250 (500 Ca) MG tablet Take 1 tablet by mouth.  . cholecalciferol (VITAMIN D) 400 units TABS tablet Take 400 Units by mouth.  .  [DISCONTINUED] HYDROcodone-acetaminophen (NORCO/VICODIN) 5-325 MG tablet Take 1 tablet by mouth every 6 (six) hours as needed for moderate pain. (Patient not taking: Reported on 04/15/2020)  . [DISCONTINUED] ibuprofen (ADVIL,MOTRIN) 800 MG tablet Take 1 tablet (800 mg total) by mouth every 8 (eight) hours as needed (mild pain). (Patient not taking: Reported on 04/15/2020)   No facility-administered encounter medications on file as of 04/15/2020.     ROS: Gen: no fever, chills  Skin: no rash, itching ENT: no ear pain, ear drainage, nasal congestion, rhinorrhea, sinus pressure, sore throat Eyes: no blurry vision, double vision Resp: no cough, wheeze,SOB  CV: no CP, palpitations, LE edema,  GI: no heartburn, n/v/d/c, abd pain GU: no dysuria, urgency, frequency, hematuria MSK: no joint pain, myalgias, back pain Neuro: no dizziness, headache, weakness, vertigo Psych: no depression, anxiety, insomnia   No Known Allergies  BP (!) 81/60 (BP Location: Left Arm, Patient Position: Sitting, Cuff Size: Normal)   Pulse 62   Temp 97.7 F (36.5 C) (Temporal)   Ht 5' 1.25" (1.556 m)   Wt 126 lb 9.6 oz (57.4 kg)   SpO2 97%   BMI 23.73 kg/m   Physical Exam Constitutional:      General: She is not in acute distress.    Appearance: Normal appearance. She is normal weight. She is not ill-appearing.  Cardiovascular:     Rate and Rhythm: Normal rate and regular rhythm.  Pulmonary:     Effort: Pulmonary effort is normal. No respiratory distress.  Skin:    General: Skin is warm and dry.       Neurological:     General: No focal deficit present.     Mental Status: She is alert and oriented to person, place, and time.  Psychiatric:        Mood and Affect: Mood normal.        Behavior: Behavior normal.      A/P:  1. Age-related osteoporosis without current pathological fracture - DG Bone Density; Future - Basic metabolic panel; Future - VITAMIN D 25 Hydroxy (Vit-D Deficiency, Fractures);  Future  2. Encounter for screening mammogram for malignant neoplasm of breast - MM DIGITAL SCREENING BILATERAL; Future  3. Screening for lipid disorders - Lipid panel; Future  4. History of hypothyroidism - not on meds x years - T4, free; Future - TSH; Future  5. Need for pneumococcal vaccination - Pneumococcal conjugate vaccine 13-valent IM  6. Dermatitis - appt with derm in 2 wks Rx: - triamcinolone cream (KENALOG) 0.1 %; Apply 1 application topically 2 (two) times daily.  Dispense: 45 g; Refill: 1  Discussed plan and reviewed  medications with patient, including risks, benefits, and potential side effects. Pt expressed understand. All questions answered.   This visit occurred during the SARS-CoV-2 public health emergency.  Safety protocols were in place, including screening questions prior to the visit, additional usage of staff PPE, and extensive cleaning of exam room while observing appropriate contact time as indicated for disinfecting solutions.

## 2020-04-22 ENCOUNTER — Other Ambulatory Visit: Payer: Self-pay

## 2020-04-22 ENCOUNTER — Other Ambulatory Visit (INDEPENDENT_AMBULATORY_CARE_PROVIDER_SITE_OTHER): Payer: Medicare Other

## 2020-04-22 DIAGNOSIS — L309 Dermatitis, unspecified: Secondary | ICD-10-CM | POA: Diagnosis not present

## 2020-04-22 DIAGNOSIS — Z13220 Encounter for screening for lipoid disorders: Secondary | ICD-10-CM | POA: Diagnosis not present

## 2020-04-22 DIAGNOSIS — Z8639 Personal history of other endocrine, nutritional and metabolic disease: Secondary | ICD-10-CM

## 2020-04-22 DIAGNOSIS — M810 Age-related osteoporosis without current pathological fracture: Secondary | ICD-10-CM | POA: Diagnosis not present

## 2020-04-22 LAB — LIPID PANEL
Cholesterol: 266 mg/dL — ABNORMAL HIGH (ref 0–200)
HDL: 65.7 mg/dL (ref >39.00)
LDL Cholesterol: 180 mg/dL — ABNORMAL HIGH (ref 0–99)
NonHDL: 200.7
Total CHOL/HDL Ratio: 4
Triglycerides: 104 mg/dL (ref 0.0–149.0)
VLDL: 20.8 mg/dL (ref 0.0–40.0)

## 2020-04-22 LAB — TSH: TSH: 6.95 u[IU]/mL — ABNORMAL HIGH (ref 0.35–4.50)

## 2020-04-22 LAB — BASIC METABOLIC PANEL
BUN: 13 mg/dL (ref 6–23)
CO2: 29 mEq/L (ref 19–32)
Calcium: 9.2 mg/dL (ref 8.4–10.5)
Chloride: 105 mEq/L (ref 96–112)
Creatinine, Ser: 0.94 mg/dL (ref 0.40–1.20)
GFR: 59.68 mL/min — ABNORMAL LOW (ref >60.00)
Glucose, Bld: 89 mg/dL (ref 70–99)
Potassium: 4.3 mEq/L (ref 3.5–5.1)
Sodium: 139 mEq/L (ref 135–145)

## 2020-04-22 LAB — VITAMIN D 25 HYDROXY (VIT D DEFICIENCY, FRACTURES): VITD: >120 ng/mL

## 2020-04-22 LAB — T4, FREE: Free T4: 0.72 ng/dL (ref 0.60–1.60)

## 2020-04-22 LAB — ALT: ALT: 11 U/L (ref 0–35)

## 2020-04-22 LAB — AST: AST: 18 U/L (ref 0–37)

## 2020-04-23 ENCOUNTER — Encounter: Payer: Self-pay | Admitting: Family Medicine

## 2020-04-23 ENCOUNTER — Other Ambulatory Visit: Payer: Self-pay | Admitting: Family Medicine

## 2020-04-23 DIAGNOSIS — E673 Hypervitaminosis D: Secondary | ICD-10-CM

## 2020-04-23 DIAGNOSIS — Z8639 Personal history of other endocrine, nutritional and metabolic disease: Secondary | ICD-10-CM

## 2020-04-26 NOTE — Telephone Encounter (Signed)
Please see message . Thank you .

## 2021-04-13 ENCOUNTER — Telehealth: Payer: Self-pay | Admitting: Family Medicine

## 2021-04-13 NOTE — Telephone Encounter (Signed)
Left message for patient to call back and schedule Medicare Annual Wellness Visit (AWV).   Please offer to do virtually or by telephone.   Due for AWVI  Please schedule at anytime with Nurse Health Advisor.   

## 2021-05-12 ENCOUNTER — Other Ambulatory Visit: Payer: Self-pay | Admitting: Obstetrics & Gynecology

## 2021-05-12 DIAGNOSIS — M8580 Other specified disorders of bone density and structure, unspecified site: Secondary | ICD-10-CM

## 2021-10-20 ENCOUNTER — Encounter: Payer: Self-pay | Admitting: Gastroenterology

## 2021-11-09 ENCOUNTER — Other Ambulatory Visit: Payer: Self-pay

## 2021-11-09 ENCOUNTER — Ambulatory Visit
Admission: RE | Admit: 2021-11-09 | Discharge: 2021-11-09 | Disposition: A | Discharge status: Home / Self Care | Payer: Medicare Other | Source: Ambulatory Visit | Attending: Obstetrics & Gynecology | Admitting: Obstetrics & Gynecology

## 2021-11-09 DIAGNOSIS — M8580 Other specified disorders of bone density and structure, unspecified site: Secondary | ICD-10-CM

## 2021-11-23 ENCOUNTER — Other Ambulatory Visit: Payer: Self-pay

## 2021-11-23 ENCOUNTER — Ambulatory Visit (AMBULATORY_SURGERY_CENTER): Payer: Medicare Other | Admitting: *Deleted

## 2021-11-23 VITALS — Ht 63.0 in | Wt 120.0 lb

## 2021-11-23 DIAGNOSIS — Z1211 Encounter for screening for malignant neoplasm of colon: Secondary | ICD-10-CM

## 2021-11-23 MED ORDER — NA SULFATE-K SULFATE-MG SULF 17.5-3.13-1.6 GM/177ML PO SOLN: 1.0000 | Freq: Once | ORAL | 0 refills | Status: AC

## 2021-11-23 NOTE — Progress Notes (Signed)
No egg or soy allergy known to patient  No issues known to pt with past sedation with any surgeries or procedures Patient denies ever being told they had issues or difficulty with intubation  No FH of Malignant Hyperthermia Pt is not on diet pills Pt is not on  home 02  Pt is not on blood thinners  Pt denies issues with constipation  No A fib or A flutter  Pt is  vaccinated  for Covid    Due to the COVID-19 pandemic we are asking patients to follow certain guidelines in PV and the [facility]   Pt aware of COVID protocols and [facility] guidelines   PV completed over the phone. Pt verified name, DOB, address and insurance during PV today.  Pt mailed instruction packet with copy of consent form to read and not return, and instructions.  Pt encouraged to call with questions or issues.  If pt has My chart, procedure instructions sent via My Chart  ,pt.stated "I forgot password will try to reset".

## 2021-12-05 ENCOUNTER — Encounter: Payer: Self-pay | Admitting: Gastroenterology

## 2021-12-05 ENCOUNTER — Ambulatory Visit (AMBULATORY_SURGERY_CENTER): Payer: Medicare Other | Admitting: Gastroenterology

## 2021-12-05 VITALS — BP 122/63 | HR 71 | Temp 97.7°F | Resp 15 | Ht 63.0 in | Wt 120.0 lb

## 2021-12-05 DIAGNOSIS — Z1211 Encounter for screening for malignant neoplasm of colon: Secondary | ICD-10-CM

## 2021-12-05 DIAGNOSIS — D123 Benign neoplasm of transverse colon: Secondary | ICD-10-CM

## 2021-12-05 MED ORDER — SODIUM CHLORIDE 0.9 % IV SOLN: 500.0000 mL | Freq: Once | INTRAVENOUS | Status: AC

## 2021-12-05 NOTE — Progress Notes (Signed)
Pt's states no medical or surgical changes since previsit or office visit.  ° °VS DT °

## 2021-12-05 NOTE — Progress Notes (Signed)
[facility] History and Physical   Primary Care Physician:  Pcp, No   Reason for Procedure:   Colorectal cancer screening  Plan:     colonoscopy     HPI: Nancy Farley is a 66 y.o. female    Past Medical History:  Diagnosis Date   GERD (gastroesophageal reflux disease)    Hypothyroidism    "NO MEDICATIONS"   Insomnia    Joint pain    Leg cramps    Osteoporosis    Scoliosis     Past Surgical History:  Procedure Laterality Date   ANTERIOR AND POSTERIOR REPAIR N/A 09/07/2016   Procedure: REPAIR ANTERIOR AND  , VAULT PROLAPSE AND GRAFT;  Surgeon: Bjorn Loser, MD;  Location: [facility];  Service: Urology;  Laterality: N/A;   CYSTOSCOPY N/A 09/07/2016   Procedure: CYSTOSCOPY;  Surgeon: Bjorn Loser, MD;  Location: [facility];  Service: Urology;  Laterality: N/A;   VAGINAL HYSTERECTOMY Bilateral 09/07/2016   Procedure: HYSTERECTOMY VAGINAL/Bilateral Salpingo-Oophorectomy;  Surgeon: Servando Salina, MD;  Location: [facility];  Service: Gynecology;  Laterality: Bilateral;   VAGINAL PROLAPSE REPAIR N/A 09/07/2016   Procedure: PROLAPSE VAULT SUSPENSION GRAFT;  Surgeon: Bjorn Loser, MD;  Location: [facility];  Service: Urology;  Laterality: N/A;    Prior to Admission medications   Medication Sig Start Date End Date Taking? Authorizing Provider  Calcium Carb-Cholecalciferol (CALTRATE 600+D3 PO) Take by mouth. Take one daily   Yes [provider]  Multiple Vitamins-Minerals (MULTIVITAMIN ADULTS 50+ PO) Take by mouth. Womens take one daily   Yes [provider]  Omega-3 Fatty Acids (FISH OIL) 600 MG CAPS Take by mouth. Sam's brand double strength take one daily   Yes [provider]  b complex vitamins tablet Take 1 tablet by mouth daily. Patient not taking: Reported on 11/23/2021    [provider]  calcium carbonate (OS-CAL - DOSED IN MG OF ELEMENTAL CALCIUM) 1250 (500 Ca) MG tablet Take 1 tablet by mouth. Patient not taking: Reported on  11/23/2021    [provider]  cholecalciferol (VITAMIN D) 400 units TABS tablet Take 400 Units by mouth. Patient not taking: Reported on 11/23/2021    [provider]  triamcinolone cream (KENALOG) 0.1 % Apply 1 application topically 2 (two) times daily. Patient not taking: Reported on 11/23/2021 04/15/20   Ronnald Nian, DO    Current Outpatient Medications  Medication Sig Dispense Refill   Calcium Carb-Cholecalciferol (CALTRATE 600+D3 PO) Take by mouth. Take one daily     Multiple Vitamins-Minerals (MULTIVITAMIN ADULTS 50+ PO) Take by mouth. Womens take one daily     Omega-3 Fatty Acids (FISH OIL) 600 MG CAPS Take by mouth. Sam's brand double strength take one daily     b complex vitamins tablet Take 1 tablet by mouth daily. (Patient not taking: Reported on 11/23/2021)     calcium carbonate (OS-CAL - DOSED IN MG OF ELEMENTAL CALCIUM) 1250 (500 Ca) MG tablet Take 1 tablet by mouth. (Patient not taking: Reported on 11/23/2021)     cholecalciferol (VITAMIN D) 400 units TABS tablet Take 400 Units by mouth. (Patient not taking: Reported on 11/23/2021)     triamcinolone cream (KENALOG) 0.1 % Apply 1 application topically 2 (two) times daily. (Patient not taking: Reported on 11/23/2021) 45 g 1   Current Facility-Administered Medications  Medication Dose Route Frequency Provider Last Rate Last Admin   0.9 %  sodium chloride infusion  500 mL Intravenous Once Jackquline Denmark, MD  Allergies as of 12/05/2021   (No Known Allergies)    Family History  Problem Relation Age of Onset   Asthma Mother    Heart disease Father    Breast cancer Sister    Colon cancer Neg Hx    Colon polyps Neg Hx    Esophageal cancer Neg Hx    Rectal cancer Neg Hx    Stomach cancer Neg Hx     Social History   Socioeconomic History   Marital status: Married    Spouse name: Not on file   Number of children: Not on file   Years of education: Not on file   Highest education level: Not on file   Occupational History   Not on file  Tobacco Use   Smoking status: Never   Smokeless tobacco: Never  Vaping Use   Vaping Use: Never used  Substance and Sexual Activity   Alcohol use: No    Alcohol/week: 0.0 standard drinks   Drug use: No   Sexual activity: Not on file  Other Topics Concern   Not on file  Social History Narrative   Not on file   Social Determinants of Health   Financial Resource Strain: Not on file  Food Insecurity: Not on file  Transportation Needs: Not on file  Physical Activity: Not on file  Stress: Not on file  Social Connections: Not on file  Intimate Partner Violence: Not on file    Review of Systems: Positive for none.  did get dizzy with IV insertion All other review of systems negative except as mentioned in the HPI.  Physical Exam: Vital signs in last 24 hours: @VSRANGES @   General:   Alert,  Well-developed, well-nourished, pleasant and cooperative in NAD Lungs:  Clear throughout to auscultation.   Heart:  Regular rate and rhythm; no murmurs, clicks, rubs,  or gallops. Abdomen:  Soft, nontender and nondistended. Normal bowel sounds.   Neuro/Psych:  Alert and cooperative. Normal mood and affect. A and O x 3    No significant changes were identified.  The patient continues to be an appropriate candidate for the planned procedure and anesthesia.   Carmell Austria, MD. [facility] 12/05/2021 2:56 PM@

## 2021-12-05 NOTE — Progress Notes (Signed)
1510 Ephedrine 10 mg given IV due to low BP, MD updated.

## 2021-12-05 NOTE — Progress Notes (Signed)
PATIENT BECAME DIZZY DURING IV INSERTION. VS: BP 103/68 , HR 58, O2 SAT OF 100%. AFTER COMPLETION OF IV INSERTION, PATIENT RETURNED TO HER NORMAL COREY, CRNA NOTIFIED OF PATIENT DIZZINESS.   Reviewed RG

## 2021-12-05 NOTE — Progress Notes (Signed)
Called to room to assist during endoscopic procedure.  Patient ID and intended procedure confirmed with present staff. Received instructions for my participation in the procedure from the performing physician.  

## 2021-12-05 NOTE — Progress Notes (Signed)
Report given to PACU, vss 

## 2021-12-05 NOTE — Op Note (Signed)
[facility] Patient Name: Nancy Farley Procedure Date: 12/05/2021 2:57 PM MRN: [id number] Endoscopist: Jackquline Denmark , MD Age: 66 Referring MD:  Date of Birth: 10-Feb-1956 Gender: Female Account #: [id number] Procedure:                Colonoscopy Indications:              Screening for colorectal malignant neoplasm Medicines:                Monitored Anesthesia Care Procedure:                Pre-Anesthesia Assessment:                           - Prior to the procedure, a History and Physical                            was performed, and patient medications and                            allergies were reviewed. The patient's tolerance of                            previous anesthesia was also reviewed. The risks                            and benefits of the procedure and the sedation                            options and risks were discussed with the patient.                            All questions were answered, and informed consent                            was obtained. Prior Anticoagulants: The patient has                            taken no previous anticoagulant or antiplatelet                            agents. ASA Grade Assessment: II - A patient with                            mild systemic disease. After reviewing the risks                            and benefits, the patient was deemed in                            satisfactory condition to undergo the procedure.                           After obtaining informed consent, the colonoscope  was passed under direct vision. Throughout the                            procedure, the patient's blood pressure, pulse, and                            oxygen saturations were monitored continuously. The                            Olympus PCF-H190DL ([id number]) Colonoscope was                            introduced through the anus and advanced to the 2                            cm into the ileum.  The colonoscopy was performed                            without difficulty. The patient tolerated the                            procedure well. The quality of the bowel                            preparation was good. The terminal ileum, ileocecal                            valve, appendiceal orifice, and rectum were                            photographed. Scope In: 3:08:26 PM Scope Out: 3:24:11 PM Scope Withdrawal Time: 0 hours 11 minutes 3 seconds  Total Procedure Duration: 0 hours 15 minutes 45 seconds  Findings:                 A 6 mm polyp was found in the mid transverse colon.                            The polyp was sessile. The polyp was removed with a                            cold snare. Resection and retrieval were complete.                           Non-bleeding internal hemorrhoids were found during                            retroflexion. The hemorrhoids were small and Grade                            I (internal hemorrhoids that do not prolapse).                           The terminal ileum appeared normal.  The exam was otherwise without abnormality on                            direct and retroflexion views. The colon was highly                            redundant. Complications:            No immediate complications. Estimated Blood Loss:     Estimated blood loss: none. Impression:               - One 6 mm polyp in the mid transverse colon,                            removed with a cold snare. Resected and retrieved.                           - The examined portion of the ileum was normal.                           - The examination was otherwise normal on direct                            and retroflexion views. Recommendation:           - Patient has a contact number available for                            emergencies. The signs and symptoms of potential                            delayed complications were discussed with the                             patient. Return to normal activities tomorrow.                            Written discharge instructions were provided to the                            patient.                           - Resume previous diet.                           - Continue present medications.                           - Await pathology results.                           - Repeat colonoscopy for surveillance based on                            pathology results.                           -  The findings and recommendations were discussed                            with the patient's son. Jackquline Denmark, MD 12/05/2021 3:28:03 PM This report has been signed electronically.

## 2021-12-05 NOTE — Patient Instructions (Signed)
Thank you for letting us take care of your healthcare needs today. Please see handouts given to you on Polyps and Hemorrhoids.     YOU HAD AN ENDOSCOPIC PROCEDURE TODAY AT THE [facility]:   Refer to the procedure report that was given to you for any specific questions about what was found during the examination.  If the procedure report does not answer your questions, please call your gastroenterologist to clarify.  If you requested that your care partner not be given the details of your procedure findings, then the procedure report has been included in a sealed envelope for you to review at your convenience later.  YOU SHOULD EXPECT: Some feelings of bloating in the abdomen. Passage of more gas than usual.  Walking can help get rid of the air that was put into your GI tract during the procedure and reduce the bloating. If you had a lower endoscopy (such as a colonoscopy or flexible sigmoidoscopy) you may notice spotting of blood in your stool or on the toilet paper. If you underwent a bowel prep for your procedure, you may not have a normal bowel movement for a few days.  Please Note:  You might notice some irritation and congestion in your nose or some drainage.  This is from the oxygen used during your procedure.  There is no need for concern and it should clear up in a day or so.  SYMPTOMS TO REPORT IMMEDIATELY:  Following lower endoscopy (colonoscopy or flexible sigmoidoscopy):  Excessive amounts of blood in the stool  Significant tenderness or worsening of abdominal pains  Swelling of the abdomen that is new, acute  Fever of 100F or higher  For urgent or emergent issues, a gastroenterologist can be reached at any hour by calling [phone]. Do not use MyChart messaging for urgent concerns.    DIET:  We do recommend a small meal at first, but then you may proceed to your regular diet.  Drink plenty of fluids but you should avoid alcoholic beverages for 24  hours.  ACTIVITY:  You should plan to take it easy for the rest of today and you should NOT DRIVE or use heavy machinery until tomorrow (because of the sedation medicines used during the test).    FOLLOW UP: Our staff will call the number listed on your records 48-72 hours following your procedure to check on you and address any questions or concerns that you may have regarding the information given to you following your procedure. If we do not reach you, we will leave a message.  We will attempt to reach you two times.  During this call, we will ask if you have developed any symptoms of COVID 19. If you develop any symptoms (ie: fever, flu-like symptoms, shortness of breath, cough etc.) before then, please call [phone].  If you test positive for Covid 19 in the 2 weeks post procedure, please call and report this information to us.    If any biopsies were taken you will be contacted by phone or by letter within the next 1-3 weeks.  Please call us at [phone] if you have not heard about the biopsies in 3 weeks.    SIGNATURES/CONFIDENTIALITY: You and/or your care partner have signed paperwork which will be entered into your electronic medical record.  These signatures attest to the fact that that the information above on your After Visit Summary has been reviewed and is understood.  Full responsibility of the confidentiality of this discharge information   lies with you and/or your care-partner.  

## 2021-12-07 ENCOUNTER — Telehealth: Payer: Self-pay

## 2021-12-07 NOTE — Telephone Encounter (Signed)
°  Follow up Call-  Call back number 12/05/2021  Post procedure Call Back phone  # [phone]  Permission to leave phone message Yes  Some recent data might be hidden     Patient questions:  Do you have a fever, pain , or abdominal swelling? No. Pain Score  0 *  Have you tolerated food without any problems? Yes.    Have you been able to return to your normal activities? Yes.    Do you have any questions about your discharge instructions: Diet   No. Medications  No. Follow up visit  No.  Do you have questions or concerns about your Care? No.  Actions: * If pain score is 4 or above: No action needed, pain <4.

## 2021-12-07 NOTE — Telephone Encounter (Signed)
°  Follow up Call-  Call back number 12/05/2021  Post procedure Call Back phone  # [phone]  Permission to leave phone message Yes  Some recent data might be hidden      Post procedure f/u call attempted, no answer, left voicemail

## 2021-12-11 ENCOUNTER — Encounter: Payer: Self-pay | Admitting: Gastroenterology

## 2022-04-24 ENCOUNTER — Telehealth: Payer: Self-pay

## 2022-04-24 ENCOUNTER — Ambulatory Visit (INDEPENDENT_AMBULATORY_CARE_PROVIDER_SITE_OTHER): Payer: Medicare Other | Admitting: Family Medicine

## 2022-04-24 ENCOUNTER — Encounter: Payer: Self-pay | Admitting: Family Medicine

## 2022-04-24 VITALS — BP 112/74 | HR 70 | Temp 97.5°F | Ht 61.5 in | Wt 127.8 lb

## 2022-04-24 DIAGNOSIS — Z9189 Other specified personal risk factors, not elsewhere classified: Secondary | ICD-10-CM

## 2022-04-24 DIAGNOSIS — M8580 Other specified disorders of bone density and structure, unspecified site: Secondary | ICD-10-CM | POA: Diagnosis not present

## 2022-04-24 DIAGNOSIS — Z131 Encounter for screening for diabetes mellitus: Secondary | ICD-10-CM

## 2022-04-24 DIAGNOSIS — E785 Hyperlipidemia, unspecified: Secondary | ICD-10-CM | POA: Diagnosis not present

## 2022-04-24 DIAGNOSIS — E039 Hypothyroidism, unspecified: Secondary | ICD-10-CM | POA: Diagnosis not present

## 2022-04-24 DIAGNOSIS — N189 Chronic kidney disease, unspecified: Secondary | ICD-10-CM

## 2022-04-24 DIAGNOSIS — R058 Other specified cough: Secondary | ICD-10-CM

## 2022-04-24 DIAGNOSIS — R739 Hyperglycemia, unspecified: Secondary | ICD-10-CM

## 2022-04-24 HISTORY — DX: Hypothyroidism, unspecified: E03.9

## 2022-04-24 HISTORY — DX: Hyperlipidemia, unspecified: E78.5

## 2022-04-24 HISTORY — DX: Chronic kidney disease, unspecified: N18.9

## 2022-04-24 LAB — VITAMIN D 25 HYDROXY (VIT D DEFICIENCY, FRACTURES): VITD: >120 ng/mL (ref 30.00–100.00)

## 2022-04-24 LAB — COMPREHENSIVE METABOLIC PANEL
ALT: 13 U/L (ref 0–35)
AST: 21 U/L (ref 0–37)
Albumin: 4.1 g/dL (ref 3.5–5.2)
Alkaline Phosphatase: 42 U/L (ref 39–117)
BUN: 16 mg/dL (ref 6–23)
CO2: 29 mEq/L (ref 19–32)
Calcium: 9.7 mg/dL (ref 8.4–10.5)
Chloride: 102 mEq/L (ref 96–112)
Creatinine, Ser: 0.94 mg/dL (ref 0.40–1.20)
GFR: 62.82 mL/min (ref >60.00)
Glucose, Bld: 78 mg/dL (ref 70–99)
Potassium: 3.9 mEq/L (ref 3.5–5.1)
Sodium: 137 mEq/L (ref 135–145)
Total Bilirubin: 0.6 mg/dL (ref 0.2–1.2)
Total Protein: 7.7 g/dL (ref 6.0–8.3)

## 2022-04-24 LAB — LIPID PANEL
Cholesterol: 284 mg/dL — ABNORMAL HIGH (ref 0–200)
HDL: 79.7 mg/dL (ref >39.00)
LDL Cholesterol: 183 mg/dL — ABNORMAL HIGH (ref 0–99)
NonHDL: 204.38
Total CHOL/HDL Ratio: 4
Triglycerides: 109 mg/dL (ref 0.0–149.0)
VLDL: 21.8 mg/dL (ref 0.0–40.0)

## 2022-04-24 LAB — HEMOGLOBIN A1C: Hgb A1c MFr Bld: 5.6 % (ref 4.6–6.5)

## 2022-04-24 NOTE — Progress Notes (Signed)
   Nancy Farley is a 66 y.o. female who presents today for an office visit.  Assessment/Plan:  Problems Addressed Today: Problem List Items Addressed This Visit       Respiratory   Post-viral cough syndrome    Improving Recommend supportive care and continue to monitorProgression        Endocrine   Hypothyroidism    Recheck TSH/thyroid panel      Relevant Orders   Thyroid Panel With TSH     Musculoskeletal and Integument   Osteopenia - Primary    With hypocalcemia Continue OTC vitamin D/calcium supplements Check vitamin D Check CMP        Genitourinary   Chronic kidney disease    Mild Recheck CMP      Relevant Orders   Comprehensive metabolic panel     Other   Hyperlipidemia    Recheck lipid panel      Relevant Orders   Lipid panel   Hyperglycemia    CMP Check hemoglobin A1c      Relevant Orders   Hemoglobin A1c   Hypocalcemia   Relevant Orders   VITAMIN D 25 Hydroxy (Vit-D Deficiency, Fractures)   Other Visit Diagnoses     Screening for diabetes mellitus       At high risk for fracture              Subjective:  HPI:  Patient here for return visit to clinic for routine blood work.  Patient with history of osteopenia, this was seen on DEXA 11/2021.  Patient is on OTC vitamin D and calcium supplements.  Patient with history of hypothyroidism.  2021 TSH was around 7, T4 was normal.  Patient is not taking any levothyroxine.  Does not have any symptoms of hypothyroidism endorsed today.  Patient with history of hyperglycemia seen on BMP 5 years ago.  This was around 140.  BMP 2 years ago had serum glucose within normal limits.  Patient without symptoms of polyuria polydipsia today.  Patient mildly elevated creatinine approximate 2 years ago with GFR slightly less than 60.  Likely associated with CKD given age.  No urinary symptoms.  Patient with mild history of hypocalcemia approximately 5 years ago, calcium was around 8.  Patient has no  psychiatric, abdominal or cramping symptoms.  Patient with viral illness at the end of May, this associated with chills and cough and rhinitis.  Rhinitis and chills have resolved.  Cough has been improving slowly over the past 4 weeks.  Patient has been taking OTC Coricidin, which she stopped about 3 to 4 days ago.  There is no chest pain or shortness of breath associated with this.         Objective:  Physical Exam: BP 112/74 (BP Location: Left Arm, Patient Position: Sitting, Cuff Size: Large)   Pulse 70   Temp (!) 97.5 F (36.4 C) (Temporal)   Ht 5' 1.5" (1.562 m)   Wt 127 lb 12.8 oz (58 kg)   SpO2 98%   BMI 23.76 kg/m   Gen: No acute distress, resting comfortably CV: Regular rate and rhythm with no murmurs appreciated Pulm: Normal work of breathing, clear to auscultation bilaterally with no crackles, wheezes, or rhonchi Neuro: Grossly normal, moves all extremities Psych: Normal affect and thought content      Alesia Banda, MD, MS

## 2022-04-24 NOTE — Assessment & Plan Note (Addendum)
Improving Recommend supportive care and continue to monitorProgression

## 2022-04-24 NOTE — Assessment & Plan Note (Signed)
Recheck lipid panel 

## 2022-04-24 NOTE — Assessment & Plan Note (Signed)
Recheck TSH/thyroid panel

## 2022-04-24 NOTE — Telephone Encounter (Signed)
Received critical lab from Nancy Farley regarding Vitamin D labs resulting >120. Spoke with Dr. Grandville Silos and advised him of critical lab. Was advised by Dr. Grandville Silos to advise patient to take vitamin d medication every other day and we will recheck in 3 months. Tried calling patient on home phone and left patient a detailed voice message to return call to office for scheduling 3 month lab f/u to check vitamin D levels.

## 2022-04-24 NOTE — Assessment & Plan Note (Signed)
With hypocalcemia Continue OTC vitamin D/calcium supplements Check vitamin D Check CMP

## 2022-04-24 NOTE — Patient Instructions (Addendum)
We are checking labs today, continue to monitor for cough

## 2022-04-24 NOTE — Assessment & Plan Note (Signed)
Mild Recheck CMP

## 2022-04-24 NOTE — Assessment & Plan Note (Signed)
CMP Check hemoglobin A1c

## 2022-04-26 LAB — THYROID PANEL WITH TSH
Free Thyroxine Index: 2.6 (ref 1.4–3.8)
T3 Uptake: 30 % (ref 22–35)
T4, Total: 8.6 ug/dL (ref 5.1–11.9)
TSH: 4.16 mIU/L (ref 0.40–4.50)

## 2024-11-26 ENCOUNTER — Ambulatory Visit: Admitting: Family Medicine

## 2024-11-27 ENCOUNTER — Ambulatory Visit: Admitting: Family Medicine
